# Patient Record
Sex: Female | Born: 1960 | Race: White | Hispanic: No | Marital: Married | Smoking: Former smoker
Health system: Southern US, Community
[De-identification: ages and names within clinical notes are randomized; demographics above are authoritative.]

## PROBLEM LIST (undated history)

## (undated) DIAGNOSIS — F329 Major depressive disorder, single episode, unspecified: Secondary | ICD-10-CM

## (undated) DIAGNOSIS — E039 Hypothyroidism, unspecified: Secondary | ICD-10-CM

## (undated) DIAGNOSIS — F419 Anxiety disorder, unspecified: Secondary | ICD-10-CM

## (undated) DIAGNOSIS — Z8489 Family history of other specified conditions: Secondary | ICD-10-CM

## (undated) DIAGNOSIS — Z9889 Other specified postprocedural states: Secondary | ICD-10-CM

## (undated) DIAGNOSIS — R112 Nausea with vomiting, unspecified: Secondary | ICD-10-CM

## (undated) DIAGNOSIS — Z8614 Personal history of Methicillin resistant Staphylococcus aureus infection: Secondary | ICD-10-CM

## (undated) DIAGNOSIS — R569 Unspecified convulsions: Secondary | ICD-10-CM

## (undated) DIAGNOSIS — E079 Disorder of thyroid, unspecified: Secondary | ICD-10-CM

## (undated) DIAGNOSIS — K219 Gastro-esophageal reflux disease without esophagitis: Secondary | ICD-10-CM

## (undated) DIAGNOSIS — T4145XA Adverse effect of unspecified anesthetic, initial encounter: Secondary | ICD-10-CM

## (undated) DIAGNOSIS — G5600 Carpal tunnel syndrome, unspecified upper limb: Secondary | ICD-10-CM

## (undated) DIAGNOSIS — F32A Depression, unspecified: Secondary | ICD-10-CM

## (undated) DIAGNOSIS — T8859XA Other complications of anesthesia, initial encounter: Secondary | ICD-10-CM

## (undated) HISTORY — PX: TUBAL LIGATION: SHX77

## (undated) HISTORY — PX: LASIK: SHX215

---

## 1988-07-03 HISTORY — PX: TUBAL LIGATION: SHX77

## 2006-07-03 HISTORY — PX: LASIK: SHX215

## 2014-09-18 DIAGNOSIS — E039 Hypothyroidism, unspecified: Secondary | ICD-10-CM | POA: Insufficient documentation

## 2014-09-18 DIAGNOSIS — N951 Menopausal and female climacteric states: Secondary | ICD-10-CM | POA: Insufficient documentation

## 2014-09-18 DIAGNOSIS — F419 Anxiety disorder, unspecified: Secondary | ICD-10-CM | POA: Insufficient documentation

## 2015-02-05 ENCOUNTER — Encounter: Payer: Self-pay | Admitting: Emergency Medicine

## 2015-02-05 ENCOUNTER — Ambulatory Visit: Payer: No Typology Code available for payment source

## 2015-02-05 ENCOUNTER — Ambulatory Visit
Admission: EM | Admit: 2015-02-05 | Discharge: 2015-02-05 | Disposition: A | Discharge status: Home / Self Care | Payer: No Typology Code available for payment source | Attending: Family Medicine | Admitting: Family Medicine

## 2015-02-05 DIAGNOSIS — E079 Disorder of thyroid, unspecified: Secondary | ICD-10-CM | POA: Diagnosis not present

## 2015-02-05 DIAGNOSIS — S29011A Strain of muscle and tendon of front wall of thorax, initial encounter: Secondary | ICD-10-CM

## 2015-02-05 DIAGNOSIS — F17210 Nicotine dependence, cigarettes, uncomplicated: Secondary | ICD-10-CM | POA: Insufficient documentation

## 2015-02-05 DIAGNOSIS — Z79899 Other long term (current) drug therapy: Secondary | ICD-10-CM | POA: Diagnosis not present

## 2015-02-05 DIAGNOSIS — S161XXA Strain of muscle, fascia and tendon at neck level, initial encounter: Secondary | ICD-10-CM | POA: Insufficient documentation

## 2015-02-05 DIAGNOSIS — M542 Cervicalgia: Secondary | ICD-10-CM | POA: Diagnosis present

## 2015-02-05 DIAGNOSIS — R079 Chest pain, unspecified: Secondary | ICD-10-CM | POA: Diagnosis present

## 2015-02-05 HISTORY — DX: Disorder of thyroid, unspecified: E07.9

## 2015-02-05 LAB — URINALYSIS COMPLETE WITH MICROSCOPIC (ARMC ONLY)
BACTERIA UA: NONE SEEN — AB
BILIRUBIN URINE: NEGATIVE
Glucose, UA: NEGATIVE mg/dL
Hgb urine dipstick: NEGATIVE
KETONES UR: NEGATIVE mg/dL
Leukocytes, UA: NEGATIVE
Nitrite: NEGATIVE
PROTEIN: 30 mg/dL — AB
Specific Gravity, Urine: 1.025 (ref 1.005–1.030)
pH: 7 (ref 5.0–8.0)

## 2015-02-05 MED ORDER — CYCLOBENZAPRINE HCL 10 MG PO TABS: 10.0000 mg | ORAL_TABLET | Freq: Three times a day (TID) | ORAL | Status: DC | PRN
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
## 2015-02-05 NOTE — Discharge Instructions (Signed)
Cervical Sprain A cervical sprain is when the tissues (ligaments) that hold the neck bones in place stretch or tear. HOME CARE   Put ice on the injured area.  Put ice in a plastic bag.  Place a towel between your skin and the bag.  Leave the ice on for 15-20 minutes, 3-4 times a day.  You may have been given a collar to wear. This collar keeps your neck from moving while you heal.  Do not take the collar off unless told by your doctor.  If you have long hair, keep it outside of the collar.  Ask your doctor before changing the position of your collar. You may need to change its position over time to make it more comfortable.  If you are allowed to take off the collar for cleaning or bathing, follow your doctor's instructions on how to do it safely.  Keep your collar clean by wiping it with mild soap and water. Dry it completely. If the collar has removable pads, remove them every 1-2 days to hand wash them with soap and water. Allow them to air dry. They should be dry before you wear them in the collar.  Do not drive while wearing the collar.  Only take medicine as told by your doctor.  Keep all doctor visits as told.  Keep all physical therapy visits as told.  Adjust your work station so that you have good posture while you work.  Avoid positions and activities that make your problems worse.  Warm up and stretch before being active. GET HELP IF:  Your pain is not controlled with medicine.  You cannot take less pain medicine over time as planned.  Your activity level does not improve as expected. GET HELP RIGHT AWAY IF:   You are bleeding.  Your stomach is upset.  You have an allergic reaction to your medicine.  You develop new problems that you cannot explain.  You lose feeling (become numb) or you cannot move any part of your body (paralysis).  You have tingling or weakness in any part of your body.  Your symptoms get worse. Symptoms include:  Pain,  soreness, stiffness, puffiness (swelling), or a burning feeling in your neck.  Pain when your neck is touched.  Shoulder or upper back pain.  Limited ability to move your neck.  Headache.  Dizziness.  Your hands or arms feel week, lose feeling, or tingle.  Muscle spasms.  Difficulty swallowing or chewing. MAKE SURE YOU:   Understand these instructions.  Will watch your condition.  Will get help right away if you are not doing well or get worse. Document Released: 12/06/2007 Document Revised: 02/19/2013 Document Reviewed: 12/25/2012 [facility] Patient Information 2015 [place], [place]. This information is not intended to replace advice given to you by your health care provider. Make sure you discuss any questions you have with your health care provider. Costochondritis Costochondritis, sometimes called Tietze syndrome, is a swelling and irritation (inflammation) of the tissue (cartilage) that connects your ribs with your breastbone (sternum). It causes pain in the chest and rib area. Costochondritis usually goes away on its own over time. It can take up to 6 weeks or longer to get better, especially if you are unable to limit your activities. CAUSES  Some cases of costochondritis have no known cause. Possible causes include: Injury (trauma). Exercise or activity such as lifting. Severe coughing. SIGNS AND SYMPTOMS Pain and tenderness in the chest and rib area. Pain that gets worse when coughing or  taking deep breaths. Pain that gets worse with specific movements. DIAGNOSIS  Your health care provider will do a physical exam and ask about your symptoms. Chest X-rays or other tests may be done to rule out other problems. TREATMENT  Costochondritis usually goes away on its own over time. Your health care provider may prescribe medicine to help relieve pain. HOME CARE INSTRUCTIONS  Avoid exhausting physical activity. Try not to strain your ribs during normal activity. This would  include any activities using chest, abdominal, and side muscles, especially if heavy weights are used. Apply ice to the affected area for the first 2 days after the pain begins. Put ice in a plastic bag. Place a towel between your skin and the bag. Leave the ice on for 20 minutes, 2-3 times a day. Only take over-the-counter or prescription medicines as directed by your health care provider. SEEK MEDICAL CARE IF: You have redness or swelling at the rib joints. These are signs of infection. Your pain does not go away despite rest or medicine. SEEK IMMEDIATE MEDICAL CARE IF:  Your pain increases or you are very uncomfortable. You have shortness of breath or difficulty breathing. You cough up blood. You have worse chest pains, sweating, or vomiting. You have a fever or persistent symptoms for more than 2-3 days. You have a fever and your symptoms suddenly get worse. MAKE SURE YOU:  Understand these instructions. Will watch your condition. Will get help right away if you are not doing well or get worse. Document Released: 03/29/2005 Document Revised: 04/09/2013 Document Reviewed: 01/21/2013 [facility] Patient Information 2015 [place], [place]. This information is not intended to replace advice given to you by your health care provider. Make sure you discuss any questions you have with your health care provider.

## 2015-09-20 ENCOUNTER — Other Ambulatory Visit: Payer: Self-pay | Admitting: Family Medicine

## 2015-09-20 DIAGNOSIS — Z8249 Family history of ischemic heart disease and other diseases of the circulatory system: Secondary | ICD-10-CM

## 2015-09-23 ENCOUNTER — Ambulatory Visit: Admission: RE | Admit: 2015-09-23 | Payer: BLUE CROSS/BLUE SHIELD | Source: Ambulatory Visit

## 2015-09-24 ENCOUNTER — Ambulatory Visit: Payer: No Typology Code available for payment source

## 2015-09-28 ENCOUNTER — Ambulatory Visit
Admission: RE | Admit: 2015-09-28 | Discharge: 2015-09-28 | Disposition: A | Discharge status: Home / Self Care | Payer: BLUE CROSS/BLUE SHIELD | Source: Ambulatory Visit | Attending: Family Medicine | Admitting: Family Medicine

## 2015-09-28 DIAGNOSIS — Z8249 Family history of ischemic heart disease and other diseases of the circulatory system: Secondary | ICD-10-CM

## 2015-09-28 MED ORDER — IOPAMIDOL (ISOVUE-370) INJECTION 76%
75.0000 mL | Freq: Once | INTRAVENOUS | Status: AC | PRN
  Administered 2015-09-28: 75 mL via INTRAVENOUS

## 2017-05-15 ENCOUNTER — Other Ambulatory Visit: Payer: Self-pay | Admitting: Family Medicine

## 2017-05-15 DIAGNOSIS — Z1231 Encounter for screening mammogram for malignant neoplasm of breast: Secondary | ICD-10-CM

## 2017-06-07 ENCOUNTER — Ambulatory Visit
Admission: RE | Admit: 2017-06-07 | Discharge: 2017-06-07 | Disposition: A | Discharge status: Home / Self Care | Payer: BLUE CROSS/BLUE SHIELD | Source: Ambulatory Visit | Attending: Family Medicine | Admitting: Family Medicine

## 2017-06-07 DIAGNOSIS — Z1231 Encounter for screening mammogram for malignant neoplasm of breast: Secondary | ICD-10-CM | POA: Insufficient documentation

## 2018-03-22 ENCOUNTER — Ambulatory Visit: Payer: Self-pay | Admitting: Surgery

## 2018-03-22 NOTE — H&P (Signed)
Subjective:   CC: Lump of skin of back [R22.2]  HPI:  Julie Pace is a 57 y.o. female who was referred by Heron Nay * for evaluation of above. First noted several years ago.  Symptoms include: Pain is intermittent, discomfort while carrying a purse on that shoulder.  Exacerbated by above.  Alleviated by nothing specific.  Associated with no other symptoms such as overlying skin change or discharge to to the area.     Past Medical History:  has a past medical history of Anxiety, Hypothyroidism, unspecified, and Menopause.  Past Surgical History:  has a past surgical history that includes Tubal ligation.  Family History: family history includes Alzheimer's disease in her mother; Coronary Artery Disease (Blocked arteries around heart) in her father and mother; Dementia in her father.  Social History:  reports that she has quit smoking. She has never used smokeless tobacco. She reports that she does not drink alcohol or use drugs.  Current Medications: has a current medication list which includes the following prescription(s): levothyroxine and naproxen.  Allergies:      Allergies  Allergen Reactions  . Codeine Rash  . Penicillin Dizziness  . Vicodin [Hydrocodone-Acetaminophen] Nausea    ROS:  A 15 point review of systems was performed and pertinent positives and negatives noted in HPI   Objective:   BP 126/67   Pulse 64   Temp 36.7 C (98 F) (Oral)   Ht 172.7 cm (5\' 8" )   Wt 78.9 kg (173 lb 15.1 oz)   BMI 26.45 kg/m   Constitutional :  alert, appears stated age, cooperative and no distress  Lymphatics/Throat:  no asymmetry, masses, or scars  Respiratory:  clear to auscultation bilaterally  Cardiovascular:  regular rate and rhythm, S1, S2 normal, no murmur, click, rub or gallop  Gastrointestinal: soft, non-tender; bowel sounds normal; no masses,  no organomegaly.    Musculoskeletal: Steady gait and movement  Skin: Cool and moist, right shoulder  lump at base of neck and right shoulder on trapezious, somewhat deep and immobile, but non-tender, measuring approx 5cm x 3.5cm.  No overlying skin changes  Psychiatric: Normal affect, non-agitated, not confused       LABS:  n/a   RADS: n/a  Assessment:      Lump of skin of back [R22.2]  Plan:   1. Lump of skin of back [R22.2] Discussed surgical excision.  Alternatives include continued observation.  Benefits include possible symptom relief, pathologic evaluation, improved cosmesis. Discussed the risk of surgery including recurrence, chronic pain, post-op infxn, poor cosmesis, poor/delayed wound healing, and possible re-operation to address said risks. The risks of general anesthetic, if used, includes MI, CVA, sudden death or even reaction to anesthetic medications also discussed.  Typical post-op recovery time of 3-5 days with possible activity restrictions were also discussed.  The patient verbalized understanding and all questions were answered to the patient's satisfaction.  2. Patient has elected to proceed with surgical treatment in OR due to possible involvement of muscle deep to it as well as its location near the neck. Procedure will be scheduled.  Written consent was obtained.     Electronically signed by Benjamine Sprague, DO on 03/20/2018 11:32 AM

## 2018-03-22 NOTE — H&P (View-Only) (Signed)
Subjective:   CC: Lump of skin of back [R22.2]  HPI:  Julie Pace is a 57 y.o. female who was referred by Heron Nay * for evaluation of above. First noted several years ago.  Symptoms include: Pain is intermittent, discomfort while carrying a purse on that shoulder.  Exacerbated by above.  Alleviated by nothing specific.  Associated with no other symptoms such as overlying skin change or discharge to to the area.     Past Medical History:  has a past medical history of Anxiety, Hypothyroidism, unspecified, and Menopause.  Past Surgical History:  has a past surgical history that includes Tubal ligation.  Family History: family history includes Alzheimer's disease in her mother; Coronary Artery Disease (Blocked arteries around heart) in her father and mother; Dementia in her father.  Social History:  reports that she has quit smoking. She has never used smokeless tobacco. She reports that she does not drink alcohol or use drugs.  Current Medications: has a current medication list which includes the following prescription(s): levothyroxine and naproxen.  Allergies:      Allergies  Allergen Reactions  . Codeine Rash  . Penicillin Dizziness  . Vicodin [Hydrocodone-Acetaminophen] Nausea    ROS:  A 15 point review of systems was performed and pertinent positives and negatives noted in HPI   Objective:   BP 126/67   Pulse 64   Temp 36.7 C (98 F) (Oral)   Ht 172.7 cm (5\' 8" )   Wt 78.9 kg (173 lb 15.1 oz)   BMI 26.45 kg/m   Constitutional :  alert, appears stated age, cooperative and no distress  Lymphatics/Throat:  no asymmetry, masses, or scars  Respiratory:  clear to auscultation bilaterally  Cardiovascular:  regular rate and rhythm, S1, S2 normal, no murmur, click, rub or gallop  Gastrointestinal: soft, non-tender; bowel sounds normal; no masses,  no organomegaly.    Musculoskeletal: Steady gait and movement  Skin: Cool and moist, right shoulder  lump at base of neck and right shoulder on trapezious, somewhat deep and immobile, but non-tender, measuring approx 5cm x 3.5cm.  No overlying skin changes  Psychiatric: Normal affect, non-agitated, not confused       LABS:  n/a   RADS: n/a  Assessment:      Lump of skin of back [R22.2]  Plan:   1. Lump of skin of back [R22.2] Discussed surgical excision.  Alternatives include continued observation.  Benefits include possible symptom relief, pathologic evaluation, improved cosmesis. Discussed the risk of surgery including recurrence, chronic pain, post-op infxn, poor cosmesis, poor/delayed wound healing, and possible re-operation to address said risks. The risks of general anesthetic, if used, includes MI, CVA, sudden death or even reaction to anesthetic medications also discussed.  Typical post-op recovery time of 3-5 days with possible activity restrictions were also discussed.  The patient verbalized understanding and all questions were answered to the patient's satisfaction.  2. Patient has elected to proceed with surgical treatment in OR due to possible involvement of muscle deep to it as well as its location near the neck. Procedure will be scheduled.  Written consent was obtained.     Electronically signed by Benjamine Sprague, DO on 03/20/2018 11:32 AM

## 2018-03-26 ENCOUNTER — Other Ambulatory Visit: Payer: Self-pay

## 2018-03-26 ENCOUNTER — Encounter
Admission: RE | Admit: 2018-03-26 | Discharge: 2018-03-26 | Disposition: A | Discharge status: Home / Self Care | Payer: BLUE CROSS/BLUE SHIELD | Source: Ambulatory Visit | Attending: Surgery | Admitting: Surgery

## 2018-03-26 HISTORY — DX: Other complications of anesthesia, initial encounter: T88.59XA

## 2018-03-26 HISTORY — DX: Family history of other specified conditions: Z84.89

## 2018-03-26 HISTORY — DX: Depression, unspecified: F32.A

## 2018-03-26 HISTORY — DX: Carpal tunnel syndrome, unspecified upper limb: G56.00

## 2018-03-26 HISTORY — DX: Nausea with vomiting, unspecified: R11.2

## 2018-03-26 HISTORY — DX: Hypothyroidism, unspecified: E03.9

## 2018-03-26 HISTORY — DX: Major depressive disorder, single episode, unspecified: F32.9

## 2018-03-26 HISTORY — DX: Personal history of Methicillin resistant Staphylococcus aureus infection: Z86.14

## 2018-03-26 HISTORY — DX: Adverse effect of unspecified anesthetic, initial encounter: T41.45XA

## 2018-03-26 HISTORY — DX: Anxiety disorder, unspecified: F41.9

## 2018-03-26 HISTORY — DX: Other specified postprocedural states: Z98.890

## 2018-03-26 HISTORY — DX: Unspecified convulsions: R56.9

## 2018-03-26 HISTORY — DX: Gastro-esophageal reflux disease without esophagitis: K21.9

## 2018-03-26 NOTE — Patient Instructions (Signed)
Your procedure is scheduled on: 04-02-18  Report to Same Day Surgery 2nd floor medical mall [facility] Entrance-take elevator on left to 2nd floor.  Check in with surgery information desk.) To find out your arrival time please call [phone] between 1PM - 3PM on 04-01-18  Remember: Instructions that are not followed completely may result in serious medical risk, up to and including death, or upon the discretion of your surgeon and anesthesiologist your surgery may need to be rescheduled.    _x___ 1. Do not eat food after midnight the night before your procedure. You may drink clear liquids up to 2 hours before you are scheduled to arrive at the hospital for your procedure.  Do not drink clear liquids within 2 hours of your scheduled arrival to the hospital.  Clear liquids include  --Water or Apple juice without pulp  --Clear carbohydrate beverage such as ClearFast or Gatorade  --Black Coffee or Clear Tea (No milk, no creamers, do not add anything to the coffee or Tea   ____Ensure clear carbohydrate drink on the way to the hospital for bariatric patients  ____Ensure clear carbohydrate drink 3 hours before surgery for Dr Dwyane Luo patients if physician instructed.   No gum chewing or hard candies.     __x__ 2. No Alcohol for 24 hours before or after surgery.   __x__3. No Smoking or e-cigarettes for 24 prior to surgery.  Do not use any chewable tobacco products for at least 6 hour prior to surgery   ____  4. Bring all medications with you on the day of surgery if instructed.    __x__ 5. Notify your doctor if there is any change in your medical condition     (cold, fever, infections).    x___6. On the morning of surgery brush your teeth with toothpaste and water.  You may rinse your mouth with mouth wash if you wish.  Do not swallow any toothpaste or mouthwash.   Do not wear jewelry, make-up, hairpins, clips or nail polish.  Do not wear lotions, powders, or perfumes. You may wear  deodorant.  Do not shave 48 hours prior to surgery. Men may shave face and neck.  Do not bring valuables to the hospital.    [facility] is not responsible for any belongings or valuables.               Contacts, dentures or bridgework may not be worn into surgery.  Leave your suitcase in the car. After surgery it may be brought to your room.  For patients admitted to the hospital, discharge time is determined by your treatment team.  _  Patients discharged the day of surgery will not be allowed to drive home.  You will need someone to drive you home and stay with you the night of your procedure.    Please read over the following fact sheets that you were given:   [facility] Preparing for Surgery   _x___ TAKE THE FOLLOWING MEDICATION THE MORNING OF SURGERY WITH A SMALL SIP OF WATER. These include:  1. LEVOTHYROXINE  2. ZANTAC  3. TAKE A ZANTAC THE NIGHT BEFORE YOUR SURGERY  4.  5.  6.  ____Fleets enema or Magnesium Citrate as directed.   _x___ Use CHG Soap or sage wipes as directed on instruction sheet   ____ Use inhalers on the day of surgery and bring to hospital day of surgery  ____ Stop Metformin and Janumet 2 days prior to surgery.    ____  Take 1/2 of usual insulin dose the night before surgery and none on the morning surgery.   ____ Follow recommendations from Cardiologist, Pulmonologist or PCP regarding stopping Aspirin, Coumadin, Plavix ,Eliquis, Effient, or Pradaxa, and Pletal.  X____Stop Anti-inflammatories such as Advil, Aleve, Ibuprofen, Motrin, Naproxen, Naprosyn, Goodies powders or aspirin products NOW-OK to take Tylenol    _X___ Stop supplements until after surgery-STOP BLACK CURRANT SEED OIL NOW-MAY RESUME AFTER SURGERY   ____ Bring C-Pap to the hospital.

## 2018-04-01 ENCOUNTER — Encounter: Payer: Self-pay | Admitting: *Deleted

## 2018-04-02 ENCOUNTER — Ambulatory Visit
Admission: RE | Admit: 2018-04-02 | Discharge: 2018-04-02 | Disposition: A | Discharge status: Home / Self Care | Payer: BLUE CROSS/BLUE SHIELD | Source: Ambulatory Visit | Attending: Surgery | Admitting: Surgery

## 2018-04-02 ENCOUNTER — Encounter: Payer: Self-pay | Admitting: Emergency Medicine

## 2018-04-02 ENCOUNTER — Encounter
Admission: RE | Disposition: A | Discharge status: Home / Self Care | Payer: Self-pay | Source: Ambulatory Visit | Attending: Surgery

## 2018-04-02 ENCOUNTER — Ambulatory Visit: Payer: BLUE CROSS/BLUE SHIELD | Admitting: Certified Registered Nurse Anesthetist

## 2018-04-02 ENCOUNTER — Other Ambulatory Visit: Payer: Self-pay

## 2018-04-02 DIAGNOSIS — Z880 Allergy status to penicillin: Secondary | ICD-10-CM | POA: Diagnosis not present

## 2018-04-02 DIAGNOSIS — Z820 Family history of epilepsy and other diseases of the nervous system: Secondary | ICD-10-CM | POA: Diagnosis not present

## 2018-04-02 DIAGNOSIS — F419 Anxiety disorder, unspecified: Secondary | ICD-10-CM | POA: Diagnosis not present

## 2018-04-02 DIAGNOSIS — K219 Gastro-esophageal reflux disease without esophagitis: Secondary | ICD-10-CM | POA: Insufficient documentation

## 2018-04-02 DIAGNOSIS — E039 Hypothyroidism, unspecified: Secondary | ICD-10-CM | POA: Diagnosis not present

## 2018-04-02 DIAGNOSIS — Z79899 Other long term (current) drug therapy: Secondary | ICD-10-CM | POA: Insufficient documentation

## 2018-04-02 DIAGNOSIS — Z8249 Family history of ischemic heart disease and other diseases of the circulatory system: Secondary | ICD-10-CM | POA: Diagnosis not present

## 2018-04-02 DIAGNOSIS — Z885 Allergy status to narcotic agent status: Secondary | ICD-10-CM | POA: Diagnosis not present

## 2018-04-02 DIAGNOSIS — R222 Localized swelling, mass and lump, trunk: Secondary | ICD-10-CM

## 2018-04-02 DIAGNOSIS — Z888 Allergy status to other drugs, medicaments and biological substances status: Secondary | ICD-10-CM | POA: Insufficient documentation

## 2018-04-02 DIAGNOSIS — R569 Unspecified convulsions: Secondary | ICD-10-CM | POA: Diagnosis not present

## 2018-04-02 DIAGNOSIS — Z886 Allergy status to analgesic agent status: Secondary | ICD-10-CM | POA: Insufficient documentation

## 2018-04-02 DIAGNOSIS — Z8614 Personal history of Methicillin resistant Staphylococcus aureus infection: Secondary | ICD-10-CM | POA: Diagnosis not present

## 2018-04-02 DIAGNOSIS — Z87891 Personal history of nicotine dependence: Secondary | ICD-10-CM | POA: Diagnosis not present

## 2018-04-02 DIAGNOSIS — D171 Benign lipomatous neoplasm of skin and subcutaneous tissue of trunk: Secondary | ICD-10-CM | POA: Diagnosis not present

## 2018-04-02 HISTORY — PX: EXCISION MASS UPPER EXTREMETIES: SHX6704

## 2018-04-02 SURGERY — EXCISION MASS UPPER EXTREMITIES
Anesthesia: General | Site: Shoulder | Laterality: Right

## 2018-04-02 MED ORDER — FENTANYL CITRATE (PF) 100 MCG/2ML IJ SOLN
INTRAMUSCULAR | Status: DC | PRN
  Administered 2018-04-02 (×2): 50 ug via INTRAVENOUS

## 2018-04-02 MED ORDER — SCOPOLAMINE 1 MG/3DAYS TD PT72
1.0000 | MEDICATED_PATCH | Freq: Once | TRANSDERMAL | Status: DC
  Administered 2018-04-02: 1.5 mg via TRANSDERMAL

## 2018-04-02 MED ORDER — SUCCINYLCHOLINE CHLORIDE 20 MG/ML IJ SOLN
INTRAMUSCULAR | Status: AC
  Filled 2018-04-02: qty 1

## 2018-04-02 MED ORDER — ONDANSETRON HCL 4 MG/2ML IJ SOLN
INTRAMUSCULAR | Status: DC | PRN
  Administered 2018-04-02: 4 mg via INTRAVENOUS

## 2018-04-02 MED ORDER — PROPOFOL 10 MG/ML IV BOLUS
INTRAVENOUS | Status: DC | PRN
  Administered 2018-04-02: 200 mg via INTRAVENOUS

## 2018-04-02 MED ORDER — DEXAMETHASONE SODIUM PHOSPHATE 10 MG/ML IJ SOLN
INTRAMUSCULAR | Status: DC | PRN
  Administered 2018-04-02: 10 mg via INTRAVENOUS

## 2018-04-02 MED ORDER — LIDOCAINE-EPINEPHRINE (PF) 1 %-1:200000 IJ SOLN
INTRAMUSCULAR | Status: AC
  Filled 2018-04-02: qty 30

## 2018-04-02 MED ORDER — LIDOCAINE HCL (PF) 2 % IJ SOLN
INTRAMUSCULAR | Status: AC
  Filled 2018-04-02: qty 10

## 2018-04-02 MED ORDER — FENTANYL CITRATE (PF) 100 MCG/2ML IJ SOLN
INTRAMUSCULAR | Status: AC
  Filled 2018-04-02: qty 2

## 2018-04-02 MED ORDER — TRAMADOL HCL 50 MG PO TABS: 50.0000 mg | ORAL_TABLET | Freq: Four times a day (QID) | ORAL | 0 refills | Status: AC | PRN

## 2018-04-02 MED ORDER — ROCURONIUM BROMIDE 100 MG/10ML IV SOLN
INTRAVENOUS | Status: DC | PRN
  Administered 2018-04-02: 5 mg via INTRAVENOUS

## 2018-04-02 MED ORDER — SUCCINYLCHOLINE CHLORIDE 20 MG/ML IJ SOLN
INTRAMUSCULAR | Status: DC | PRN
  Administered 2018-04-02: 100 mg via INTRAVENOUS

## 2018-04-02 MED ORDER — PHENYLEPHRINE HCL 10 MG/ML IJ SOLN
INTRAMUSCULAR | Status: DC | PRN
  Administered 2018-04-02 (×2): 200 ug via INTRAVENOUS

## 2018-04-02 MED ORDER — BUPIVACAINE HCL (PF) 0.5 % IJ SOLN
INTRAMUSCULAR | Status: AC
  Filled 2018-04-02: qty 30

## 2018-04-02 MED ORDER — LACTATED RINGERS IV SOLN
INTRAVENOUS | Status: DC
  Administered 2018-04-02: 06:00:00 via INTRAVENOUS

## 2018-04-02 MED ORDER — CHLORHEXIDINE GLUCONATE CLOTH 2 % EX PADS: 6.0000 | MEDICATED_PAD | Freq: Once | CUTANEOUS | Status: DC

## 2018-04-02 MED ORDER — MIDAZOLAM HCL 2 MG/2ML IJ SOLN
INTRAMUSCULAR | Status: AC
  Filled 2018-04-02: qty 2

## 2018-04-02 MED ORDER — ROCURONIUM BROMIDE 50 MG/5ML IV SOLN
INTRAVENOUS | Status: AC
  Filled 2018-04-02: qty 1

## 2018-04-02 MED ORDER — LIDOCAINE-EPINEPHRINE (PF) 1 %-1:200000 IJ SOLN
INTRAMUSCULAR | Status: DC | PRN
  Administered 2018-04-02: 1.5 mL

## 2018-04-02 MED ORDER — IBUPROFEN 800 MG PO TABS: 800.0000 mg | ORAL_TABLET | Freq: Three times a day (TID) | ORAL | 0 refills | Status: DC | PRN

## 2018-04-02 MED ORDER — ACETAMINOPHEN 500 MG PO TABS
1000.0000 mg | ORAL_TABLET | ORAL | Status: AC
  Administered 2018-04-02: 1000 mg via ORAL

## 2018-04-02 MED ORDER — SCOPOLAMINE 1 MG/3DAYS TD PT72
MEDICATED_PATCH | TRANSDERMAL | Status: AC
  Filled 2018-04-02: qty 1

## 2018-04-02 MED ORDER — ONDANSETRON HCL 4 MG/2ML IJ SOLN
INTRAMUSCULAR | Status: AC
  Filled 2018-04-02: qty 2

## 2018-04-02 MED ORDER — ACETAMINOPHEN 325 MG PO TABS: 650.0000 mg | ORAL_TABLET | Freq: Three times a day (TID) | ORAL | 0 refills | Status: AC | PRN

## 2018-04-02 MED ORDER — SEVOFLURANE IN SOLN
RESPIRATORY_TRACT | Status: AC
  Filled 2018-04-02: qty 250

## 2018-04-02 MED ORDER — FENTANYL CITRATE (PF) 100 MCG/2ML IJ SOLN: 25.0000 ug | INTRAMUSCULAR | Status: DC | PRN

## 2018-04-02 MED ORDER — MIDAZOLAM HCL 2 MG/2ML IJ SOLN
INTRAMUSCULAR | Status: DC | PRN
  Administered 2018-04-02: 2 mg via INTRAVENOUS

## 2018-04-02 MED ORDER — ACETAMINOPHEN 500 MG PO TABS
ORAL_TABLET | ORAL | Status: AC
  Administered 2018-04-02: 1000 mg via ORAL
  Filled 2018-04-02: qty 2

## 2018-04-02 MED ORDER — CLINDAMYCIN PHOSPHATE 900 MG/50ML IV SOLN
900.0000 mg | INTRAVENOUS | Status: AC
  Administered 2018-04-02: 900 mg via INTRAVENOUS

## 2018-04-02 MED ORDER — PROMETHAZINE HCL 25 MG/ML IJ SOLN: 6.2500 mg | INTRAMUSCULAR | Status: DC | PRN

## 2018-04-02 MED ORDER — PROPOFOL 500 MG/50ML IV EMUL
INTRAVENOUS | Status: AC
  Filled 2018-04-02: qty 50

## 2018-04-02 MED ORDER — DEXAMETHASONE SODIUM PHOSPHATE 10 MG/ML IJ SOLN
INTRAMUSCULAR | Status: AC
  Filled 2018-04-02: qty 1

## 2018-04-02 MED ORDER — CLINDAMYCIN PHOSPHATE 900 MG/50ML IV SOLN
INTRAVENOUS | Status: AC
  Filled 2018-04-02: qty 50

## 2018-04-02 MED ORDER — BUPIVACAINE HCL 0.5 % IJ SOLN
INTRAMUSCULAR | Status: DC | PRN
  Administered 2018-04-02: 1.5 mL

## 2018-04-02 MED ORDER — DOCUSATE SODIUM 100 MG PO CAPS: 100.0000 mg | ORAL_CAPSULE | Freq: Two times a day (BID) | ORAL | 0 refills | Status: AC | PRN

## 2018-04-02 MED ORDER — LIDOCAINE HCL (CARDIAC) PF 100 MG/5ML IV SOSY
PREFILLED_SYRINGE | INTRAVENOUS | Status: DC | PRN
  Administered 2018-04-02: 100 mg via INTRAVENOUS

## 2018-04-02 SURGICAL SUPPLY — 27 items
CHLORAPREP W/TINT 26ML (MISCELLANEOUS) ×3 IMPLANT
DERMABOND ADVANCED (GAUZE/BANDAGES/DRESSINGS) ×2
DERMABOND ADVANCED .7 DNX12 (GAUZE/BANDAGES/DRESSINGS) ×1 IMPLANT
DRAPE LAP WINGED 104X35X124 (DRAPES) IMPLANT
DRAPE LAPAROTOMY 100X77 ABD (DRAPES) ×3 IMPLANT
DRAPE SHEET LG 3/4 BI-LAMINATE (DRAPES) IMPLANT
ELECT CAUTERY BLADE 6.4 (BLADE) ×3 IMPLANT
ELECT REM PT RETURN 9FT ADLT (ELECTROSURGICAL) ×3
ELECTRODE REM PT RTRN 9FT ADLT (ELECTROSURGICAL) ×1 IMPLANT
GLOVE BIO SURGEON STRL SZ 6.5 (GLOVE) ×8 IMPLANT
GLOVE BIO SURGEONS STRL SZ 6.5 (GLOVE) ×4
GLOVE BIOGEL PI IND STRL 7.0 (GLOVE) ×1 IMPLANT
GLOVE BIOGEL PI INDICATOR 7.0 (GLOVE) ×2
GOWN STRL REUS W/ TWL LRG LVL3 (GOWN DISPOSABLE) ×4 IMPLANT
GOWN STRL REUS W/TWL LRG LVL3 (GOWN DISPOSABLE) ×8
KIT TURNOVER KIT A (KITS) ×3 IMPLANT
LABEL OR SOLS (LABEL) ×3 IMPLANT
NEEDLE HYPO 22GX1.5 SAFETY (NEEDLE) ×3 IMPLANT
NS IRRIG 1000ML POUR BTL (IV SOLUTION) ×3 IMPLANT
PACK BASIN MINOR ARMC (MISCELLANEOUS) ×3 IMPLANT
SUT MNCRL 4-0 (SUTURE) ×2
SUT MNCRL 4-0 27XMFL (SUTURE) ×1
SUT VIC AB 3-0 SH 27 (SUTURE) ×2
SUT VIC AB 3-0 SH 27X BRD (SUTURE) ×1 IMPLANT
SUTURE MNCRL 4-0 27XMF (SUTURE) ×1 IMPLANT
SYR 30ML LL (SYRINGE) ×3 IMPLANT
TOWEL OR 17X26 4PK STRL BLUE (TOWEL DISPOSABLE) ×3 IMPLANT

## 2018-04-02 NOTE — Op Note (Signed)
Pre-Op Dx: lump on back Post-Op Dx: same Surgeon: Lysle Pearl Anesthesia: local EBL: minimal Complications:  none apparent Specimen: back mass Procedure: excisional biopsy of back mass   Description of Procedure:  Consent obtained, time out performed.  Patient placed in prone position.  Area sterilized and draped in usual position.  Local infused to area previously marked.  3.5cm incision made through dermis with 15blade and mass noted in subcutaneous layer.  The  2cm x 2cm x 1cm mass then removed from surrounding tissue completely using electrocautery down to fascial layer, passed off field pending pathology.  Wound hemostasis noted, then closed in two layer fashion with 3-0 vicryl in interrupted fashion for deep dermal layer, then running 4-0 monocryl in subcuticular fashion for epidermal layer.  Wound then dressed with dermabond.  Pt tolerated procedure well, and transferred to PACU in stable condition. Sponge and instrument count correct at end of procedure.

## 2018-04-02 NOTE — Discharge Instructions (Signed)
-   tylenol and advil as needed for discomfort.  Please alternate between the two every four hours as needed for pain.   - Use narcotics, if prescribed, only when tylenol and motrin is not enough to control pain. - 325-650mg  every 8hrs to max of 4000mg /24hrs for the tylenol.   - Advil up to 800mg  per dose every 8hrs as needed for pain.    AMBULATORY SURGERY  DISCHARGE INSTRUCTIONS   1) The drugs that you were given will stay in your system until tomorrow so for the next 24 hours you should not:  A) Drive an automobile B) Make any legal decisions C) Drink any alcoholic beverage   2) You may resume regular meals tomorrow.  Today it is better to start with liquids and gradually work up to solid foods.  You may eat anything you prefer, but it is better to start with liquids, then soup and crackers, and gradually work up to solid foods.   3) Please notify your doctor immediately if you have any unusual bleeding, trouble breathing, redness and pain at the surgery site, drainage, fever, or pain not relieved by medication.    4) Additional Instructions:        Please contact your physician with any problems or Same Day Surgery at [phone], Monday through Friday 6 am to 4 pm, or [place] at [facility] number at [phone].

## 2018-04-02 NOTE — Transfer of Care (Signed)
Immediate Anesthesia Transfer of Care Note  Patient: Taiya Nutting  Procedure(s) Performed: EXCISION MASS RIGHT SHOULDER (Right Shoulder)  Patient Location: PACU  Anesthesia Type:General  Level of Consciousness: drowsy  Airway & Oxygen Therapy: Patient Spontanous Breathing and Patient connected to face mask oxygen  Post-op Assessment: Report given to RN and Post -op Vital signs reviewed and stable  Post vital signs: Reviewed and stable  Last Vitals:  Vitals Value Taken Time  BP 127/69 04/02/2018  8:38 AM  Temp 36.3 C 04/02/2018  8:38 AM  Pulse 74 04/02/2018  8:40 AM  Resp 13 04/02/2018  8:40 AM  SpO2 97 % 04/02/2018  8:40 AM  Vitals shown include unvalidated device data.  Last Pain:  Vitals:   04/02/18 0838  TempSrc:   PainSc: Asleep         Complications: No apparent anesthesia complications

## 2018-04-02 NOTE — Anesthesia Preprocedure Evaluation (Signed)
Anesthesia Evaluation  Patient identified by MRN, date of birth, ID band Patient awake    Reviewed: Allergy & Precautions, H&P , NPO status , Patient's Chart, lab work & pertinent test results, reviewed documented beta blocker date and time   History of Anesthesia Complications (+) PONV, Family history of anesthesia reaction and history of anesthetic complications  Airway Mallampati: III  TM Distance: >3 FB Neck ROM: full    Dental  (+) Dental Advidsory Given, Caps, Teeth Intact   Pulmonary neg pulmonary ROS, former smoker,           Cardiovascular Exercise Tolerance: Good negative cardio ROS       Neuro/Psych Seizures -, Well Controlled,  PSYCHIATRIC DISORDERS Anxiety Depression    GI/Hepatic negative GI ROS, Neg liver ROS, GERD  ,  Endo/Other  neg diabetesHypothyroidism   Renal/GU negative Renal ROS  negative genitourinary   Musculoskeletal   Abdominal   Peds  Hematology negative hematology ROS (+)   Anesthesia Other Findings Past Medical History: No date: Anxiety No date: Carpal tunnel syndrome No date: Complication of anesthesia No date: Depression No date: Family history of adverse reaction to anesthesia     Comment:  PT STATES SHE THINKS HER MOM HAD REACTION TO ANESTHESIA               BUT IS UNSURE WHAT REACTION WAS No date: GERD (gastroesophageal reflux disease) No date: History of methicillin resistant staphylococcus aureus (MRSA)     Comment:  YEARS AGO No date: Hypothyroidism No date: PONV (postoperative nausea and vomiting) No date: Seizures (HCC)     Comment:  LAST SEIZURE AGE 47 No date: Thyroid disease   Reproductive/Obstetrics negative OB ROS                             Anesthesia Physical Anesthesia Plan  ASA: II  Anesthesia Plan: General   Post-op Pain Management:    Induction: Intravenous  PONV Risk Score and Plan: 4 or greater and Ondansetron,  Dexamethasone, Midazolam, Promethazine, Treatment may vary due to age or medical condition and Scopolamine patch - Pre-op  Airway Management Planned: Oral ETT  Additional Equipment:   Intra-op Plan:   Post-operative Plan: Extubation in OR  Informed Consent: I have reviewed the patients History and Physical, chart, labs and discussed the procedure including the risks, benefits and alternatives for the proposed anesthesia with the patient or authorized representative who has indicated his/her understanding and acceptance.   Dental Advisory Given  Plan Discussed with: Anesthesiologist, CRNA and Surgeon  Anesthesia Plan Comments:         Anesthesia Quick Evaluation

## 2018-04-02 NOTE — Anesthesia Procedure Notes (Signed)
Procedure Name: Intubation Date/Time: 04/02/2018 7:41 AM Performed by: Johnna Acosta, CRNA Pre-anesthesia Checklist: Patient identified, Emergency Drugs available, Suction available, Patient being monitored and Timeout performed Patient Re-evaluated:Patient Re-evaluated prior to induction Oxygen Delivery Method: Circle system utilized Preoxygenation: Pre-oxygenation with 100% oxygen Induction Type: IV induction Ventilation: Mask ventilation without difficulty Laryngoscope Size: Miller and 2 Grade View: Grade I Tube type: Oral Tube size: 7.0 mm Number of attempts: 1 Airway Equipment and Method: Stylet Placement Confirmation: ETT inserted through vocal cords under direct vision,  positive ETCO2 and breath sounds checked- equal and bilateral Secured at: 21 cm Tube secured with: Tape Dental Injury: Teeth and Oropharynx as per pre-operative assessment

## 2018-04-02 NOTE — Anesthesia Post-op Follow-up Note (Signed)
Anesthesia QCDR form completed.        

## 2018-04-02 NOTE — Interval H&P Note (Signed)
History and Physical Interval Note:  04/02/2018 7:15 AM  Julie Pace  has presented today for surgery, with the diagnosis of LUMP OF SKIN OF BACK  The various methods of treatment have been discussed with the patient and family. After consideration of risks, benefits and other options for treatment, the patient has consented to  Procedure(s): EXCISION MASS RIGHT SHOULDER (Right) as a surgical intervention .  The patient's history has been reviewed, patient examined, no change in status, stable for surgery.  I have reviewed the patient's chart and labs.  Questions were answered to the patient's satisfaction.     [name] Lysle Pearl

## 2018-04-03 LAB — SURGICAL PATHOLOGY

## 2018-04-03 NOTE — Anesthesia Postprocedure Evaluation (Signed)
Anesthesia Post Note  Patient: Mekaela Azizi  Procedure(s) Performed: EXCISION MASS RIGHT SHOULDER (Right Shoulder)  Patient location during evaluation: PACU Anesthesia Type: General Level of consciousness: awake and alert Pain management: pain level controlled Vital Signs Assessment: post-procedure vital signs reviewed and stable Respiratory status: spontaneous breathing, nonlabored ventilation, respiratory function stable and patient connected to nasal cannula oxygen Cardiovascular status: blood pressure returned to baseline and stable Postop Assessment: no apparent nausea or vomiting Anesthetic complications: no     Last Vitals:  Vitals:   04/02/18 0911 04/02/18 1001  BP: (!) 130/55 114/61  Pulse: (!) 56 (!) 56  Resp: 14 16  Temp: 36.5 C   SpO2: 100% 100%    Last Pain:  Vitals:   04/03/18 0840  TempSrc:   PainSc: 3                  Martha Clan

## 2019-06-06 DIAGNOSIS — R42 Dizziness and giddiness: Secondary | ICD-10-CM | POA: Insufficient documentation

## 2019-06-17 DIAGNOSIS — M5136 Other intervertebral disc degeneration, lumbar region: Secondary | ICD-10-CM | POA: Insufficient documentation

## 2019-06-24 ENCOUNTER — Ambulatory Visit: Payer: BC Managed Care – PPO | Attending: Family Medicine

## 2019-06-24 ENCOUNTER — Other Ambulatory Visit: Payer: Self-pay

## 2019-06-24 DIAGNOSIS — Z20822 Contact with and (suspected) exposure to covid-19: Secondary | ICD-10-CM

## 2019-06-25 LAB — NOVEL CORONAVIRUS, NAA: SARS-CoV-2, NAA: NOT DETECTED

## 2019-06-25 LAB — SPECIMEN STATUS REPORT

## 2019-09-11 ENCOUNTER — Other Ambulatory Visit: Payer: Self-pay | Admitting: Physical Medicine and Rehabilitation

## 2019-09-11 DIAGNOSIS — M5416 Radiculopathy, lumbar region: Secondary | ICD-10-CM

## 2019-09-22 ENCOUNTER — Other Ambulatory Visit: Payer: Self-pay

## 2019-09-22 ENCOUNTER — Ambulatory Visit
Admission: RE | Admit: 2019-09-22 | Discharge: 2019-09-22 | Disposition: A | Discharge status: Home / Self Care | Payer: BC Managed Care – PPO | Source: Ambulatory Visit | Attending: Physical Medicine and Rehabilitation | Admitting: Physical Medicine and Rehabilitation

## 2019-09-22 DIAGNOSIS — M5416 Radiculopathy, lumbar region: Secondary | ICD-10-CM

## 2019-10-03 ENCOUNTER — Ambulatory Visit: Payer: BC Managed Care – PPO | Attending: Internal Medicine

## 2019-10-03 ENCOUNTER — Other Ambulatory Visit: Payer: Self-pay

## 2019-10-03 DIAGNOSIS — Z23 Encounter for immunization: Secondary | ICD-10-CM

## 2019-10-03 NOTE — Progress Notes (Signed)
   Covid-19 Vaccination Clinic  Name:  Julie Pace    MRN: [id number] DOB: 1961/03/11  10/03/2019  Julie Pace was observed post Covid-19 immunization for 15 minutes without incident. She was provided with Vaccine Information Sheet and instruction to access the V-Safe system.   Julie Pace was instructed to call 911 with any severe reactions post vaccine: Marland Kitchen Difficulty breathing  . Swelling of face and throat  . A fast heartbeat  . A bad rash all over body  . Dizziness and weakness   Immunizations Administered    Name Date Dose VIS Date Route   Pfizer COVID-19 Vaccine 10/03/2019 10:47 AM 0.3 mL 06/13/2019 Intramuscular   Manufacturer: [place]   Lot: [id number]   [place]: [id number]

## 2019-10-29 ENCOUNTER — Ambulatory Visit: Payer: BC Managed Care – PPO

## 2019-10-30 ENCOUNTER — Ambulatory Visit: Payer: BC Managed Care – PPO | Attending: Internal Medicine

## 2019-10-30 DIAGNOSIS — Z23 Encounter for immunization: Secondary | ICD-10-CM

## 2019-10-30 NOTE — Progress Notes (Signed)
   Covid-19 Vaccination Clinic  Name:  Delia Lea    MRN: [id number] DOB: 02-19-1961  10/30/2019  Ms. Long-Ramos was observed post Covid-19 immunization for 15 minutes without incident. She was provided with Vaccine Information Sheet and instruction to access the V-Safe system.   Ms. Turczyn was instructed to call 911 with any severe reactions post vaccine: Marland Kitchen Difficulty breathing  . Swelling of face and throat  . A fast heartbeat  . A bad rash all over body  . Dizziness and weakness   Immunizations Administered    Name Date Dose VIS Date Route   Pfizer COVID-19 Vaccine 10/30/2019 10:39 AM 0.3 mL 08/27/2018 Intramuscular   Manufacturer: [place]   Lot: [id number]   [place]: [id number]

## 2019-11-11 ENCOUNTER — Ambulatory Visit: Payer: BC Managed Care – PPO

## 2021-04-05 ENCOUNTER — Other Ambulatory Visit: Payer: Self-pay

## 2021-04-06 ENCOUNTER — Ambulatory Visit: Payer: BC Managed Care – PPO | Admitting: Gastroenterology

## 2021-04-06 ENCOUNTER — Other Ambulatory Visit: Payer: Self-pay

## 2021-04-06 ENCOUNTER — Encounter: Payer: Self-pay | Admitting: Gastroenterology

## 2021-04-06 VITALS — BP 146/81 | HR 87 | Temp 98.1°F | Ht 66.0 in | Wt 186.2 lb

## 2021-04-06 DIAGNOSIS — Z1211 Encounter for screening for malignant neoplasm of colon: Secondary | ICD-10-CM

## 2021-04-06 DIAGNOSIS — R194 Change in bowel habit: Secondary | ICD-10-CM

## 2021-04-06 MED ORDER — PEG 3350-KCL-NA BICARB-NACL 420 G PO SOLR: ORAL | 0 refills | Status: DC

## 2021-04-06 NOTE — Progress Notes (Signed)
Jonathon Bellows MD, MRCP(U.K) [address]  [place]  [place], [place] [address]  Main: [phone]  Fax: [phone]   Gastroenterology Consultation  Referring Provider:     Sofie Hartigan, MD Primary Care Physician:  Sofie Hartigan, MD Primary Gastroenterologist:  Dr. Jonathon Bellows  Reason for Consultation:     Change in bowel habits and colon cancer screening        HPI:   Julie Pace is a 60 y.o. y/o female referred for consultation & management  by Dr. Ellison Hughs, Chrissie Noa, MD.    She is here to see me to discuss for a screening colonoscopy average risk last 1 close to 10 years back.  She has also noticed some change in her bowel habits with an increased frequency usually in the mornings associated with flatulence.  She clearly denies any diarrhea.  This been going on for a few months.  She does consume artificial sweeteners daily and on and off the occasional diet soda.  No rectal bleeding.   Past Medical History:  Diagnosis Date   Anxiety    Carpal tunnel syndrome    Complication of anesthesia    Depression    Family history of adverse reaction to anesthesia    PT STATES SHE THINKS HER MOM HAD REACTION TO ANESTHESIA BUT IS UNSURE WHAT REACTION WAS   GERD (gastroesophageal reflux disease)    History of methicillin resistant staphylococcus aureus (MRSA)    YEARS AGO   Hypothyroidism    PONV (postoperative nausea and vomiting)    Seizures ([place])    LAST SEIZURE AGE 7   Thyroid disease     Past Surgical History:  Procedure Laterality Date   EXCISION MASS UPPER EXTREMETIES Right 04/02/2018   Procedure: EXCISION MASS RIGHT SHOULDER;  Surgeon: Benjamine Sprague, DO;  Location: [facility] ORS;  Service: General;  Laterality: Right;   LASIK     TUBAL LIGATION      Prior to Admission medications   Medication Sig Start Date End Date Taking? Authorizing Provider  Black Currant Seed Oil 500 MG CAPS Take 500 mg by mouth daily.    [provider]  calcium  carbonate (OSCAL) 1500 (600 Ca) MG TABS tablet Take 600 mg of elemental calcium by mouth daily with breakfast.    [provider]  Cranberry (RA CRANBERRY) 500 MG CAPS Take by mouth.    [provider]  cyanocobalamin 1000 MCG tablet Take by mouth.    [provider]  cyclobenzaprine (FLEXERIL) 10 MG tablet Take 1 tablet (10 mg total) by mouth 3 (three) times daily as needed for muscle spasms. Patient not taking: Reported on 03/25/2018 02/05/15   Norval Gable, MD  diclofenac sodium (VOLTAREN) 1 % GEL Apply 1 application topically daily as needed (pain).     [provider]  etodolac (LODINE) 400 MG tablet Take by mouth. 05/10/20 05/10/21  [provider]  fluticasone (FLONASE) 50 MCG/ACT nasal spray Place 1 spray into both nostrils daily as needed for allergies or rhinitis.    [provider]  gabapentin (NEURONTIN) 100 MG capsule Take by mouth. 06/17/19   [provider]  ibuprofen (ADVIL,MOTRIN) 800 MG tablet Take 1 tablet (800 mg total) by mouth every 8 (eight) hours as needed for mild pain or moderate pain. 04/02/18   Benjamine Sprague, DO  levothyroxine (SYNTHROID, LEVOTHROID) 175 MCG tablet Take 175 mcg by mouth daily before breakfast. 01/15/18   [provider]  Misc Natural  Products (COLON HERBAL CLEANSER) CAPS Take by mouth.    [provider]  ranitidine (ZANTAC) 150 MG capsule Take 150 mg by mouth daily as needed for heartburn.     [provider]  triamcinolone (NASACORT) 55 MCG/ACT AERO nasal inhaler Place into the nose.    [provider]    No family history on file.   Social History   Tobacco Use   Smoking status: Former    Packs/day: 0.25    Years: 10.00    Pack years: 2.50    Types: Cigarettes    Quit date: 11/23/2016    Years since quitting: 4.3   Smokeless tobacco: Never  Vaping Use   Vaping Use: Former  Substance Use Topics   Alcohol use: Yes    Comment: BEER OCC   Drug use:  No    Allergies as of 04/06/2021 - Review Complete 04/06/2021  Allergen Reaction Noted   Codeine Nausea Only and Other (See Comments) 02/05/2015   Penicillins Hives and Other (See Comments) 02/05/2015   Phenobarbital Other (See Comments) 03/21/2018   Vicodin [hydrocodone-acetaminophen]  03/26/2018    Review of Systems:    All systems reviewed and negative except where noted in HPI.   Physical Exam:  BP (!) 146/81   Pulse 87   Temp 98.1 F (36.7 C) (Oral)   Ht 5\' 6"  (1.676 m)   Wt 186 lb 3.2 oz (84.5 kg)   BMI 30.05 kg/m  No LMP recorded. Patient is postmenopausal. Psych:  Alert and cooperative. Normal mood and affect. General:   Alert,  Well-developed, well-nourished, pleasant and cooperative in NAD Head:  Normocephalic and atraumatic. Eyes:  Sclera clear, no icterus.   Conjunctiva pink. Ears:  Normal auditory acuity. Neurologic:  Alert and oriented x3;  grossly normal neurologically. Psych:  Alert and cooperative. Normal mood and affect.  Imaging Studies: No results found.  Assessment and Plan:   Julie Pace is a 60 y.o. y/o female has been referred for possible diarrhea but her symptoms are just increased frequency of bowel movements which may very well be secondary to consuming artificial sugars in her diet which have a laxative effect.  She is due for a screening colonoscopy.   Plan 1.  Colon cancer screening colonoscopy average risk 2.  Stop all artificial sugars and sweeteners increase dietary fiber.  Commence on Metamucil 1 tablespoon twice daily or 3 times daily.  She will inform me on the day of the procedure if it has been helping or not.   I have discussed alternative options, risks & benefits,  which include, but are not limited to, bleeding, infection, perforation,respiratory complication & drug reaction.  The patient agrees with this plan & written consent will be obtained.     Follow up in as needed  Dr Jonathon Bellows MD,MRCP(U.K)

## 2021-04-06 NOTE — Patient Instructions (Signed)
High-Fiber Eating Plan °Fiber, also called dietary fiber, is a type of carbohydrate. It is found foods such as fruits, vegetables, whole grains, and beans. A high-fiber diet can have many health benefits. Your health care provider may recommend a high-fiber diet to help: °Prevent constipation. Fiber can make your bowel movements more regular. °Lower your cholesterol. °Relieve the following conditions: °Inflammation of veins in the anus (hemorrhoids). °Inflammation of specific areas of the digestive tract (uncomplicated diverticulosis). °A problem of the large intestine, also called the colon, that sometimes causes pain and diarrhea (irritable bowel syndrome, or IBS). °Prevent overeating as part of a weight-loss plan. °Prevent heart disease, type 2 diabetes, and certain cancers. °What are tips for following this plan? °Reading food labels ° °Check the nutrition facts label on food products for the amount of dietary fiber. Choose foods that have 5 grams of fiber or more per serving. °The goals for recommended daily fiber intake include: °Men (age 50 or younger): 34-38 g. °Men (over age 50): 28-34 g. °Women (age 50 or younger): 25-28 g. °Women (over age 50): 22-25 g. °Your daily fiber goal is _____________ g. °Shopping °Choose whole fruits and vegetables instead of processed forms, such as apple juice or applesauce. °Choose a wide variety of high-fiber foods such as avocados, lentils, oats, and kidney beans. °Read the nutrition facts label of the foods you choose. Be aware of foods with added fiber. These foods often have high sugar and sodium amounts per serving. °Cooking °Use whole-grain flour for baking and cooking. °Cook with brown rice instead of white rice. °Meal planning °Start the day with a breakfast that is high in fiber, such as a cereal that contains 5 g of fiber or more per serving. °Eat breads and cereals that are made with whole-grain flour instead of refined flour or white flour. °Eat brown rice, bulgur  wheat, or millet instead of white rice. °Use beans in place of meat in soups, salads, and pasta dishes. °Be sure that half of the grains you eat each day are whole grains. °General information °You can get the recommended daily intake of dietary fiber by: °Eating a variety of fruits, vegetables, grains, nuts, and beans. °Taking a fiber supplement if you are not able to take in enough fiber in your diet. It is better to get fiber through food than from a supplement. °Gradually increase how much fiber you consume. If you increase your intake of dietary fiber too quickly, you may have bloating, cramping, or gas. °Drink plenty of water to help you digest fiber. °Choose high-fiber snacks, such as berries, raw vegetables, nuts, and popcorn. °What foods should I eat? °Fruits °Berries. Pears. Apples. Oranges. Avocado. Prunes and raisins. Dried figs. °Vegetables °Sweet potatoes. Spinach. Kale. Artichokes. Cabbage. Broccoli. Cauliflower. Green peas. Carrots. Squash. °Grains °Whole-grain breads. Multigrain cereal. Oats and oatmeal. Brown rice. Barley. Bulgur wheat. Millet. Quinoa. Bran muffins. Popcorn. Rye wafer crackers. °Meats and other proteins °Navy beans, kidney beans, and pinto beans. Soybeans. Split peas. Lentils. Nuts and seeds. °Dairy °Fiber-fortified yogurt. °Beverages °Fiber-fortified soy milk. Fiber-fortified orange juice. °Other foods °Fiber bars. °The items listed above may not be a complete list of recommended foods and beverages. Contact a dietitian for more information. °What foods should I avoid? °Fruits °Fruit juice. Cooked, strained fruit. °Vegetables °Fried potatoes. Canned vegetables. Well-cooked vegetables. °Grains °White bread. Pasta made with refined flour. White rice. °Meats and other proteins °Fatty cuts of meat. Fried chicken or fried fish. °Dairy °Milk. Yogurt. Cream cheese. Sour cream. °Fats and   oils °Butters. °Beverages °Soft drinks. °Other foods °Cakes and pastries. °The items listed above may  not be a complete list of foods and beverages to avoid. Talk with your dietitian about what choices are best for you. °Summary °Fiber is a type of carbohydrate. It is found in foods such as fruits, vegetables, whole grains, and beans. °A high-fiber diet has many benefits. It can help to prevent constipation, lower blood cholesterol, aid weight loss, and reduce your risk of heart disease, diabetes, and certain cancers. °Increase your intake of fiber gradually. Increasing fiber too quickly may cause cramping, bloating, and gas. Drink plenty of water while you increase the amount of fiber you consume. °The best sources of fiber include whole fruits and vegetables, whole grains, nuts, seeds, and beans. °This information is not intended to replace advice given to you by your health care provider. Make sure you discuss any questions you have with your health care provider. °Document Revised: 10/23/2019 Document Reviewed: 10/23/2019 °Elsevier Patient Education © 2022 Elsevier Inc. ° °

## 2021-04-19 ENCOUNTER — Encounter
Admission: RE | Disposition: A | Discharge status: Home / Self Care | Payer: Self-pay | Source: Home / Self Care | Attending: Gastroenterology

## 2021-04-19 ENCOUNTER — Ambulatory Visit: Payer: BC Managed Care – PPO | Admitting: Anesthesiology

## 2021-04-19 ENCOUNTER — Ambulatory Visit
Admission: RE | Admit: 2021-04-19 | Discharge: 2021-04-19 | Disposition: A | Discharge status: Home / Self Care | Payer: BC Managed Care – PPO | Attending: Gastroenterology | Admitting: Gastroenterology

## 2021-04-19 ENCOUNTER — Encounter: Payer: Self-pay | Admitting: Gastroenterology

## 2021-04-19 ENCOUNTER — Other Ambulatory Visit: Payer: Self-pay

## 2021-04-19 DIAGNOSIS — Z1211 Encounter for screening for malignant neoplasm of colon: Secondary | ICD-10-CM

## 2021-04-19 DIAGNOSIS — D126 Benign neoplasm of colon, unspecified: Secondary | ICD-10-CM

## 2021-04-19 DIAGNOSIS — Z888 Allergy status to other drugs, medicaments and biological substances status: Secondary | ICD-10-CM | POA: Insufficient documentation

## 2021-04-19 DIAGNOSIS — Z791 Long term (current) use of non-steroidal anti-inflammatories (NSAID): Secondary | ICD-10-CM | POA: Insufficient documentation

## 2021-04-19 DIAGNOSIS — Z79899 Other long term (current) drug therapy: Secondary | ICD-10-CM | POA: Diagnosis not present

## 2021-04-19 DIAGNOSIS — D122 Benign neoplasm of ascending colon: Secondary | ICD-10-CM | POA: Diagnosis not present

## 2021-04-19 DIAGNOSIS — D120 Benign neoplasm of cecum: Secondary | ICD-10-CM | POA: Insufficient documentation

## 2021-04-19 DIAGNOSIS — Z880 Allergy status to penicillin: Secondary | ICD-10-CM | POA: Insufficient documentation

## 2021-04-19 DIAGNOSIS — Z885 Allergy status to narcotic agent status: Secondary | ICD-10-CM | POA: Diagnosis not present

## 2021-04-19 DIAGNOSIS — Z87891 Personal history of nicotine dependence: Secondary | ICD-10-CM | POA: Diagnosis not present

## 2021-04-19 HISTORY — PX: COLONOSCOPY WITH PROPOFOL: SHX5780

## 2021-04-19 SURGERY — COLONOSCOPY WITH PROPOFOL
Anesthesia: General

## 2021-04-19 MED ORDER — DEXMEDETOMIDINE HCL IN NACL 200 MCG/50ML IV SOLN
INTRAVENOUS | Status: DC | PRN
  Administered 2021-04-19: 4 ug via INTRAVENOUS

## 2021-04-19 MED ORDER — PROPOFOL 500 MG/50ML IV EMUL
INTRAVENOUS | Status: DC | PRN
  Administered 2021-04-19: 120 ug/kg/min via INTRAVENOUS

## 2021-04-19 MED ORDER — SODIUM CHLORIDE 0.9 % IV SOLN: INTRAVENOUS | Status: DC

## 2021-04-19 MED ORDER — DEXMEDETOMIDINE HCL IN NACL 200 MCG/50ML IV SOLN
INTRAVENOUS | Status: AC
  Filled 2021-04-19: qty 50

## 2021-04-19 MED ORDER — LIDOCAINE HCL (PF) 2 % IJ SOLN
INTRAMUSCULAR | Status: AC
  Filled 2021-04-19: qty 5

## 2021-04-19 MED ORDER — PROPOFOL 500 MG/50ML IV EMUL
INTRAVENOUS | Status: AC
  Filled 2021-04-19: qty 50

## 2021-04-19 MED ORDER — ONDANSETRON HCL 4 MG/2ML IJ SOLN: 4.0000 mg | Freq: Once | INTRAMUSCULAR | Status: DC | PRN

## 2021-04-19 MED ORDER — PROPOFOL 10 MG/ML IV BOLUS
INTRAVENOUS | Status: AC
  Filled 2021-04-19: qty 20

## 2021-04-19 MED ORDER — PROPOFOL 10 MG/ML IV BOLUS
INTRAVENOUS | Status: DC | PRN
  Administered 2021-04-19: 100 mg via INTRAVENOUS

## 2021-04-19 MED ORDER — LIDOCAINE HCL (CARDIAC) PF 100 MG/5ML IV SOSY
PREFILLED_SYRINGE | INTRAVENOUS | Status: DC | PRN
  Administered 2021-04-19: 50 mg via INTRAVENOUS

## 2021-04-19 NOTE — H&P (Signed)
Jonathon Bellows, MD [address] [place], [place], [place], [address] [address], [place], [place], [place], [address] Phone: [phone]  Fax: [phone]  Primary Care Physician:  Sofie Hartigan, MD   Pre-Procedure History & Physical: HPI:  Julie Pace is a 60 y.o. female is here for an colonoscopy.   Past Medical History:  Diagnosis Date   Anxiety    Carpal tunnel syndrome    Complication of anesthesia    Depression    Family history of adverse reaction to anesthesia    PT STATES SHE THINKS HER MOM HAD REACTION TO ANESTHESIA BUT IS UNSURE WHAT REACTION WAS   GERD (gastroesophageal reflux disease)    History of methicillin resistant staphylococcus aureus (MRSA)    YEARS AGO   Hypothyroidism    PONV (postoperative nausea and vomiting)    Seizures ([place])    LAST SEIZURE AGE 61   Thyroid disease     Past Surgical History:  Procedure Laterality Date   EXCISION MASS UPPER EXTREMETIES Right 04/02/2018   Procedure: EXCISION MASS RIGHT SHOULDER;  Surgeon: Benjamine Sprague, DO;  Location: [facility] ORS;  Service: General;  Laterality: Right;   LASIK     TUBAL LIGATION      Prior to Admission medications   Medication Sig Start Date End Date Taking? Authorizing Provider  Cranberry 500 MG CAPS Take by mouth.    [provider]  ibuprofen (ADVIL,MOTRIN) 800 MG tablet Take 1 tablet (800 mg total) by mouth every 8 (eight) hours as needed for mild pain or moderate pain. 04/02/18   Benjamine Sprague, DO  levothyroxine (SYNTHROID, LEVOTHROID) 175 MCG tablet Take 175 mcg by mouth daily before breakfast. 01/15/18   [provider]  polyethylene glycol-electrolytes (NULYTELY) 420 g solution Prepare according to package instructions. Starting at 5:00 PM: Drink one 8 oz glass of mixture every 15 minutes until you finish half of the jug. Five hours prior to procedure, drink 8 oz glass of mixture every 15 minutes until it is all gone. Make sure you do not drink anything  4 hours prior to your procedure. 04/06/21   Jonathon Bellows, MD  triamcinolone (NASACORT) 55 MCG/ACT AERO nasal inhaler Place into the nose.    [provider]    Allergies as of 04/06/2021 - Review Complete 04/06/2021  Allergen Reaction Noted   Codeine Nausea Only and Other (See Comments) 02/05/2015   Penicillins Hives and Other (See Comments) 02/05/2015   Phenobarbital Other (See Comments) 03/21/2018   Vicodin [hydrocodone-acetaminophen]  03/26/2018    No family history on file.  Social History   Socioeconomic History   Marital status: Married    Spouse name: Not on file   Number of children: Not on file   Years of education: Not on file   Highest education level: Not on file  Occupational History   Not on file  Tobacco Use   Smoking status: Former    Packs/day: 0.25    Years: 10.00    Pack years: 2.50    Types: Cigarettes    Quit date: 11/23/2016    Years since quitting: 4.4   Smokeless tobacco: Never  Vaping Use   Vaping Use: Former  Substance and Sexual Activity   Alcohol use: Yes    Comment: BEER OCC   Drug use: No   Sexual activity: Not on file  Other Topics Concern   Not on file  Social History Narrative   Not on file   Social  Determinants of Health   Financial Resource Strain: Not on file  Food Insecurity: Not on file  Transportation Needs: Not on file  Physical Activity: Not on file  Stress: Not on file  Social Connections: Not on file  Intimate Partner Violence: Not on file    Review of Systems: See HPI, otherwise negative ROS  Physical Exam: There were no vitals taken for this visit. General:   Alert,  pleasant and cooperative in NAD Head:  Normocephalic and atraumatic. Neck:  Supple; no masses or thyromegaly. Lungs:  Clear throughout to auscultation, normal respiratory effort.    Heart:  +S1, +S2, Regular rate and rhythm, No edema. Abdomen:  Soft, nontender and nondistended. Normal bowel sounds, without guarding, and without rebound.    Neurologic:  Alert and  oriented x4;  grossly normal neurologically.  Impression/Plan: Julie Pace is here for an colonoscopy to be performed for Screening colonoscopy average risk   Risks, benefits, limitations, and alternatives regarding  colonoscopy have been reviewed with the patient.  Questions have been answered.  All parties agreeable.   Jonathon Bellows, MD  04/19/2021, 8:02 AM

## 2021-04-19 NOTE — Transfer of Care (Signed)
Immediate Anesthesia Transfer of Care Note  Patient: Julie Pace  Procedure(s) Performed: COLONOSCOPY WITH PROPOFOL  Patient Location: PACU and Endoscopy Unit  Anesthesia Type:General  Level of Consciousness: drowsy and patient cooperative  Airway & Oxygen Therapy: Patient Spontanous Breathing  Post-op Assessment: Report given to RN and Post -op Vital signs reviewed and stable  Post vital signs: Reviewed and stable  Last Vitals:  Vitals Value Taken Time  BP 97/55 04/19/21 0910  Temp    Pulse 76 04/19/21 0913  Resp 20 04/19/21 0913  SpO2 96 % 04/19/21 0913  Vitals shown include unvalidated device data.  Last Pain:  Vitals:   04/19/21 0900  TempSrc: Temporal  PainSc:          Complications: No notable events documented.

## 2021-04-19 NOTE — Anesthesia Preprocedure Evaluation (Signed)
Anesthesia Evaluation  Patient identified by MRN, date of birth, ID band Patient awake    Reviewed: Allergy & Precautions, NPO status , Patient's Chart, lab work & pertinent test results  History of Anesthesia Complications (+) PONV and history of anesthetic complications  Airway Mallampati: II   Neck ROM: Full    Dental no notable dental hx.    Pulmonary Current Smoker (1/2 ppd) and Patient abstained from smoking.,    Pulmonary exam normal breath sounds clear to auscultation       Cardiovascular Exercise Tolerance: Good negative cardio ROS Normal cardiovascular exam Rhythm:Regular Rate:Normal     Neuro/Psych Seizures - (last age 89),  PSYCHIATRIC DISORDERS Anxiety Depression    GI/Hepatic GERD  ,  Endo/Other  Hypothyroidism   Renal/GU negative Renal ROS     Musculoskeletal   Abdominal   Peds  Hematology negative hematology ROS (+)   Anesthesia Other Findings   Reproductive/Obstetrics                             Anesthesia Physical Anesthesia Plan  ASA: 2  Anesthesia Plan: General   Post-op Pain Management:    Induction: Intravenous  PONV Risk Score and Plan: 4 or greater and Propofol infusion, TIVA and Treatment may vary due to age or medical condition  Airway Management Planned: Natural Airway  Additional Equipment:   Intra-op Plan:   Post-operative Plan:   Informed Consent: I have reviewed the patients History and Physical, chart, labs and discussed the procedure including the risks, benefits and alternatives for the proposed anesthesia with the patient or authorized representative who has indicated his/her understanding and acceptance.       Plan Discussed with: CRNA  Anesthesia Plan Comments:         Anesthesia Quick Evaluation

## 2021-04-19 NOTE — Op Note (Signed)
[facility] Gastroenterology Patient Name: Julie Pace Procedure Date: 04/19/2021 8:06 AM MRN: [id number] Account #: [id number] Date of Birth: 1961-05-05 Admit Type: Outpatient Age: 60 Room: [facility] ENDO ROOM 2 Gender: Female Note Status: Finalized Instrument Name: Jasper Riling [id number] Procedure:             Colonoscopy Indications:           Screening for colorectal malignant neoplasm Providers:             Jonathon Bellows MD, MD Referring MD:          Sofie Hartigan (Referring MD) Medicines:             Monitored Anesthesia Care Complications:         No immediate complications. Procedure:             Pre-Anesthesia Assessment:                        - Prior to the procedure, a History and Physical was                         performed, and patient medications, allergies and                         sensitivities were reviewed. The patient's tolerance                         of previous anesthesia was reviewed.                        - The risks and benefits of the procedure and the                         sedation options and risks were discussed with the                         patient. All questions were answered and informed                         consent was obtained.                        - ASA Grade Assessment: II - A patient with mild                         systemic disease.                        After obtaining informed consent, the colonoscope was                         passed under direct vision. Throughout the procedure,                         the patient's blood pressure, pulse, and oxygen                         saturations were monitored continuously. The                         Colonoscope was introduced  through the anus and                         advanced to the the cecum, identified by the                         appendiceal orifice. The colonoscopy was performed                         with ease. The patient tolerated the procedure  well.                         The quality of the bowel preparation was excellent. Findings:      The perianal and digital rectal examinations were normal.      Two sessile polyps were found in the ascending colon. The polyps were 4       to 5 mm in size. These polyps were removed with a cold snare. Resection       and retrieval were complete.      A 10 mm polyp was found in the cecum. The polyp was sessile.      Normal mucosa was found in the entire colon. Biopsies for histology were       taken with a cold forceps from the entire colon for evaluation of       microscopic colitis.      The exam was otherwise without abnormality on direct and retroflexion       views. Impression:            - Two 4 to 5 mm polyps in the ascending colon, removed                         with a cold snare. Resected and retrieved.                        - One 10 mm polyp in the cecum.                        - Normal mucosa in the entire examined colon. Biopsied.                        - The examination was otherwise normal on direct and                         retroflexion views. Recommendation:        - Discharge patient to home (with escort).                        - Resume previous diet.                        - Continue present medications.                        - Await pathology results.                        - Repeat colonoscopy for surveillance based on  pathology results.                        - Return to my office as previously scheduled. Procedure Code(s):     --- Professional ---                        [phone], Colonoscopy, flexible; with removal of                         tumor(s), polyp(s), or other lesion(s) by snare                         technique                        45380, 67, Colonoscopy, flexible; with biopsy, single                         or multiple Diagnosis Code(s):     --- Professional ---                        K63.5, Polyp of colon                         Z12.11, Encounter for screening for malignant neoplasm                         of colon CPT copyright 2019 [employer]. All rights reserved. The codes documented in this report are preliminary and upon coder review may  be revised to meet current compliance requirements. Jonathon Bellows, MD Jonathon Bellows MD, MD 04/19/2021 9:04:44 AM This report has been signed electronically. Number of Addenda: 0 Note Initiated On: 04/19/2021 8:06 AM Scope Withdrawal Time: 0 hours 17 minutes 58 seconds  Total Procedure Duration: 0 hours 20 minutes 28 seconds  Estimated Blood Loss:  Estimated blood loss: none.      [facility]

## 2021-04-19 NOTE — Anesthesia Postprocedure Evaluation (Signed)
Anesthesia Post Note  Patient: Zettie Gootee  Procedure(s) Performed: COLONOSCOPY WITH PROPOFOL  Patient location during evaluation: PACU Anesthesia Type: General Level of consciousness: awake and alert, oriented and patient cooperative Pain management: pain level controlled Vital Signs Assessment: post-procedure vital signs reviewed and stable Respiratory status: spontaneous breathing, nonlabored ventilation and respiratory function stable Cardiovascular status: blood pressure returned to baseline and stable Postop Assessment: adequate PO intake Anesthetic complications: no   No notable events documented.   Last Vitals:  Vitals:   04/19/21 0900 04/19/21 0910  BP: (!) 97/55 (!) 97/55  Pulse: 66 71  Resp: 16 19  Temp: (!) 36.1 C   SpO2: 98% 97%    Last Pain:  Vitals:   04/19/21 0900  TempSrc: Temporal  PainSc:                  Darrin Nipper

## 2021-04-20 ENCOUNTER — Encounter: Payer: Self-pay | Admitting: Gastroenterology

## 2021-04-20 LAB — SURGICAL PATHOLOGY

## 2021-04-21 ENCOUNTER — Encounter: Payer: Self-pay | Admitting: Gastroenterology

## 2021-08-12 ENCOUNTER — Ambulatory Visit
Admission: EM | Admit: 2021-08-12 | Discharge: 2021-08-12 | Disposition: A | Discharge status: Home / Self Care | Payer: BC Managed Care – PPO | Attending: Emergency Medicine | Admitting: Emergency Medicine

## 2021-08-12 ENCOUNTER — Other Ambulatory Visit: Payer: Self-pay

## 2021-08-12 DIAGNOSIS — M5441 Lumbago with sciatica, right side: Secondary | ICD-10-CM | POA: Diagnosis not present

## 2021-08-12 MED ORDER — BACLOFEN 10 MG PO TABS: 10.0000 mg | ORAL_TABLET | Freq: Three times a day (TID) | ORAL | 0 refills | Status: DC

## 2021-08-12 MED ORDER — PREDNISONE 10 MG (21) PO TBPK: ORAL_TABLET | ORAL | 0 refills | Status: DC

## 2021-08-12 NOTE — ED Triage Notes (Signed)
Patient here for "Back Pain". [place], once sat down after getting done, getting up was painful, now sitting is painful". When having BM "some pain/pressure and numbness in abd area".

## 2021-08-12 NOTE — Discharge Instructions (Signed)
Take the Prednisone as directed.  Take the baclofen, 10 mg every 8 hours, on a schedule for the next 48 hours and then as needed.  Apply moist heat to your back for 30 minutes at a time 2-3 times a day to improve blood flow to the area and help remove the lactic acid causing the spasm.  Follow the back exercises given at discharge.  Contact your PMR physician regarding your low back pain and numbness in your vaginal area as this could indicate your protruding disc is putting pressure on your spine.  If you develop a return of numbness in your vaginal area or difficulty walking you need to go to the ER immediately.

## 2021-08-12 NOTE — ED Provider Notes (Signed)
[facility]    CSN: [id number] Arrival date & time: 08/12/21  1204      History   Chief Complaint Chief Complaint  Patient presents with   Back Pain    HPI Julie Pace is a 61 y.o. female.   HPI  61 year old female here for evaluation of back pain.  Patient reports that she was pulling branches from under a bridge a day or so ago and then sat down.  After trying to get up she experienced pain in her low back and now reports that sitting is painful.  She also reports that she had some numbness to her vaginal area that lasted approximately 30 seconds to a minute after using the bathroom earlier today.  The pain is in the middle of her back and over to the right side radiating into her right hip.  She does have a history of protruding discs at the level of L3-L4.  She denies any current numbness, tingling, or weakness in any of her legs.  She also denies any trouble with walking.  Past Medical History:  Diagnosis Date   Anxiety    Carpal tunnel syndrome    Complication of anesthesia    Depression    Family history of adverse reaction to anesthesia    PT STATES SHE THINKS HER MOM HAD REACTION TO ANESTHESIA BUT IS UNSURE WHAT REACTION WAS   GERD (gastroesophageal reflux disease)    History of methicillin resistant staphylococcus aureus (MRSA)    YEARS AGO   Hypothyroidism    PONV (postoperative nausea and vomiting)    Seizures (HCC)    LAST SEIZURE AGE 65   Thyroid disease     There are no problems to display for this patient.   Past Surgical History:  Procedure Laterality Date   COLONOSCOPY WITH PROPOFOL N/A 04/19/2021   Procedure: COLONOSCOPY WITH PROPOFOL;  Surgeon: Jonathon Bellows, MD;  Location: [facility] ENDOSCOPY;  Service: Gastroenterology;  Laterality: N/A;   EXCISION MASS UPPER EXTREMETIES Right 04/02/2018   Procedure: EXCISION MASS RIGHT SHOULDER;  Surgeon: Benjamine Sprague, DO;  Location: [facility] ORS;  Service: General;  Laterality: Right;   LASIK      TUBAL LIGATION      OB History   No obstetric history on file.      Home Medications    Prior to Admission medications   Medication Sig Start Date End Date Taking? Authorizing Provider  baclofen (LIORESAL) 10 MG tablet Take 1 tablet (10 mg total) by mouth 3 (three) times daily. 08/12/21  Yes Margarette Canada, NP  levothyroxine (SYNTHROID, LEVOTHROID) 175 MCG tablet Take 175 mcg by mouth daily before breakfast. 01/15/18  Yes [provider]  predniSONE (STERAPRED UNI-PAK 21 TAB) 10 MG (21) TBPK tablet Take 6 tablets on day 1, 5 tablets day 2, 4 tablets day 3, 3 tablets day 4, 2 tablets day 5, 1 tablet day 6 08/12/21  Yes Margarette Canada, NP  Cranberry 500 MG CAPS Take by mouth.    [provider]  polyethylene glycol-electrolytes (NULYTELY) 420 g solution Prepare according to package instructions. Starting at 5:00 PM: Drink one 8 oz glass of mixture every 15 minutes until you finish half of the jug. Five hours prior to procedure, drink 8 oz glass of mixture every 15 minutes until it is all gone. Make sure you do not drink anything 4 hours prior to your procedure. 04/06/21   Jonathon Bellows, MD  triamcinolone (NASACORT) 55 MCG/ACT AERO nasal inhaler Place into the  nose.    [provider]    Family History No family history on file.  Social History Social History   Tobacco Use   Smoking status: Every Day    Packs/day: 0.25    Years: 10.00    Pack years: 2.50    Types: Cigarettes    Last attempt to quit: 11/23/2016    Years since quitting: 4.7   Smokeless tobacco: Never  Vaping Use   Vaping Use: Never used  Substance Use Topics   Alcohol use: Yes    Comment: BEER OCC   Drug use: No     Allergies   Codeine, Penicillins, Phenobarbital, and Vicodin [hydrocodone-acetaminophen]   Review of Systems Review of Systems  Genitourinary:  Negative for difficulty urinating.  Musculoskeletal:  Positive for back pain. Negative for gait problem.  Neurological:   Positive for numbness. Negative for weakness.  Hematological: Negative.   Psychiatric/Behavioral: Negative.      Physical Exam Triage Vital Signs ED Triage Vitals  Enc Vitals Group     BP 08/12/21 1231 121/79     Pulse Rate 08/12/21 1231 75     Resp 08/12/21 1231 18     Temp 08/12/21 1231 97.8 F (36.6 C)     Temp Source 08/12/21 1231 Oral     SpO2 08/12/21 1231 100 %     Weight 08/12/21 1231 186 lb 1.1 oz (84.4 kg)     Height 08/12/21 1231 5\' 6"  (1.676 m)     Head Circumference --      Peak Flow --      Pain Score 08/12/21 1230 10     Pain Loc --      Pain Edu? --      Excl. in [place]? --    No data found.  Updated Vital Signs BP 121/79 (BP Location: Left Arm) Comment: Initial   Pulse 75    Temp 97.8 F (36.6 C) (Oral)    Resp 18    Ht 5\' 6"  (1.676 m)    Wt 186 lb 1.1 oz (84.4 kg)    SpO2 100%    BMI 30.03 kg/m   Visual Acuity Right Eye Distance:   Left Eye Distance:   Bilateral Distance:    Right Eye Near:   Left Eye Near:    Bilateral Near:     Physical Exam Vitals and nursing note reviewed.  Constitutional:      General: She is not in acute distress.    Appearance: Normal appearance. She is not ill-appearing.  HENT:     Head: Normocephalic and atraumatic.  Cardiovascular:     Rate and Rhythm: Normal rate and regular rhythm.     Pulses: Normal pulses.     Heart sounds: Normal heart sounds. No murmur heard.   No friction rub. No gallop.  Pulmonary:     Effort: Pulmonary effort is normal.     Breath sounds: Normal breath sounds. No wheezing, rhonchi or rales.  Musculoskeletal:        General: Tenderness present. No deformity.  Skin:    General: Skin is warm and dry.     Findings: No erythema or rash.  Neurological:     General: No focal deficit present.     Mental Status: She is alert and oriented to person, place, and time.     Sensory: No sensory deficit.     Motor: No weakness.     Gait: Gait normal.     Deep Tendon Reflexes: Reflexes  normal.   Psychiatric:        Mood and Affect: Mood normal.        Behavior: Behavior normal.        Thought Content: Thought content normal.        Judgment: Judgment normal.     UC Treatments / Results  Labs (all labs ordered are listed, but only abnormal results are displayed) Labs Reviewed - No data to display  EKG   Radiology No results found.  Procedures Procedures (including critical care time)  Medications Ordered in UC Medications - No data to display  Initial Impression / Assessment and Plan / UC Course  I have reviewed the triage vital signs and the nursing notes.  Pertinent labs & imaging results that were available during my care of the patient were reviewed by me and considered in my medical decision making (see chart for details).  Patient is a very pleasant, nontoxic-appearing 61 year old female here for evaluation of low back pain.  Patient has a history of low back pain and is followed by Dr. Sharlet Salina at [facility].  On 2 February she did receive bilateral S1 transforaminal epidural injections under fluoroscopy.  At that time she was experiencing acute on chronic low back pain with radiation into the right buttock diffusely through the thigh, anterior and lateral aspects of the lower leg, dorsum of the foot, and first and second toes.  At that time her symptoms were consistent with L4-L5 polyradiculitis.  Her last MRI was 09/22/2019 which showed L5-S1 shallow disc bulging with mild bilateral facet arthropathy, right greater than left.  No central foraminal stenosis.  L4-L5 disc desiccation with central disc protrusion and moderate bilateral facet arthropathy and ligamentum flavum hypertrophy.  Severe central stenosis with severe right and moderate left foraminal stenosis.  L3-L4 left-sided disc bulge with moderate bilateral facet arthropathy.  Moderate to severe central stenosis.  No foraminal stenosis.  L2-L3 well-maintained disc height with mild bilateral facet  arthropathy.  No central foraminal stenosis at that level.  Patient symptoms today are centered in her central lumbar spine that radiate into her right hip.  They do not extend down into her thigh, lower leg, or foot.  She did have approximately 30 seconds worth of perineal anesthesia that resolved and remained resolved.  On physical exam patient has a normal axial carriage.  Her cardiopulmonary exam feels clung sounds in all fields.  Patient has no tenderness with palpation of the thoracic or lumbar bony prominences of the spine.  There is very mild lower lumbar upper sacral muscle tension and tenderness to palpation.  The tenderness does not extend into the buttock or down the outside of the leg.  There is no similar findings laterally.  Patient is lower extremity strength is 5/5 in her lower extremity DTRs are 2+.  Patient does have a positive seated straight leg raise on the right and a contralateral seated straight leg raise on the left.  Following a seated exam assisted the patient to stand and reviewed her ambulation.  She has a normal tandem gait.  I also had her walk on her heels and toes which she was able to do without difficulty.  Following the exam she sat in the chair and after a few minutes she complained of numbness in her left leg.  She was assisted to stand and the numbness improved.  Her gait remained stable and tandem.  There is concern, given her lumbar pathology, that she has worsening of her disc  protrusion and possible narrowing of her canal.  I have recommended that she follow-up with her doctor at [facility] for a new MRI of her lumbar spine.  I will start her on a prednisone Dosepak taper as well as baclofen.  I have cautioned the patient that she has a return of perineal or saddle anesthesia, or difficulties walking that she needs to go to the emergency department immediately.   Final Clinical Impressions(s) / UC Diagnoses   Final diagnoses:  Acute right-sided low back pain with  right-sided sciatica     Discharge Instructions      Take the Prednisone as directed.  Take the baclofen, 10 mg every 8 hours, on a schedule for the next 48 hours and then as needed.  Apply moist heat to your back for 30 minutes at a time 2-3 times a day to improve blood flow to the area and help remove the lactic acid causing the spasm.  Follow the back exercises given at discharge.  Contact your PMR physician regarding your low back pain and numbness in your vaginal area as this could indicate your protruding disc is putting pressure on your spine.  If you develop a return of numbness in your vaginal area or difficulty walking you need to go to the ER immediately.     ED Prescriptions     Medication Sig Dispense Auth. Provider   predniSONE (STERAPRED UNI-PAK 21 TAB) 10 MG (21) TBPK tablet Take 6 tablets on day 1, 5 tablets day 2, 4 tablets day 3, 3 tablets day 4, 2 tablets day 5, 1 tablet day 6 21 tablet Margarette Canada, NP   baclofen (LIORESAL) 10 MG tablet Take 1 tablet (10 mg total) by mouth 3 (three) times daily. 35 each Margarette Canada, NP      PDMP not reviewed this encounter.   Margarette Canada, NP 08/12/21 1424

## 2021-08-25 ENCOUNTER — Other Ambulatory Visit: Payer: Self-pay

## 2021-08-25 ENCOUNTER — Other Ambulatory Visit: Payer: Self-pay | Admitting: Physical Medicine and Rehabilitation

## 2021-08-25 DIAGNOSIS — M5416 Radiculopathy, lumbar region: Secondary | ICD-10-CM

## 2021-08-27 ENCOUNTER — Ambulatory Visit
Admission: RE | Admit: 2021-08-27 | Discharge: 2021-08-27 | Disposition: A | Discharge status: Home / Self Care | Payer: BC Managed Care – PPO | Source: Ambulatory Visit | Attending: Physical Medicine and Rehabilitation | Admitting: Physical Medicine and Rehabilitation

## 2021-08-27 DIAGNOSIS — M5416 Radiculopathy, lumbar region: Secondary | ICD-10-CM

## 2021-09-15 ENCOUNTER — Other Ambulatory Visit: Payer: Self-pay | Admitting: Neurosurgery

## 2021-09-23 ENCOUNTER — Other Ambulatory Visit: Payer: Self-pay

## 2021-09-23 ENCOUNTER — Encounter
Admission: RE | Admit: 2021-09-23 | Discharge: 2021-09-23 | Disposition: A | Discharge status: Home / Self Care | Payer: BC Managed Care – PPO | Source: Ambulatory Visit | Attending: Neurosurgery | Admitting: Neurosurgery

## 2021-09-23 DIAGNOSIS — Z01812 Encounter for preprocedural laboratory examination: Secondary | ICD-10-CM | POA: Diagnosis present

## 2021-09-23 LAB — BASIC METABOLIC PANEL
Anion gap: 9 (ref 5–15)
BUN: 14 mg/dL (ref 8–23)
CO2: 25 mmol/L (ref 22–32)
Calcium: 9.1 mg/dL (ref 8.9–10.3)
Chloride: 106 mmol/L (ref 98–111)
Creatinine, Ser: 0.84 mg/dL (ref 0.44–1.00)
GFR, Estimated: >60 mL/min (ref >60)
Glucose, Bld: 92 mg/dL (ref 70–99)
Potassium: 4.2 mmol/L (ref 3.5–5.1)
Sodium: 140 mmol/L (ref 135–145)

## 2021-09-23 LAB — URINALYSIS, ROUTINE W REFLEX MICROSCOPIC
Bilirubin Urine: NEGATIVE
Glucose, UA: NEGATIVE mg/dL
Hgb urine dipstick: NEGATIVE
Ketones, ur: NEGATIVE mg/dL
Leukocytes,Ua: NEGATIVE
Nitrite: NEGATIVE
Protein, ur: NEGATIVE mg/dL
Specific Gravity, Urine: 1.015 (ref 1.005–1.030)
pH: 5 (ref 5.0–8.0)

## 2021-09-23 LAB — CBC
HCT: 43.3 % (ref 36.0–46.0)
Hemoglobin: 14.4 g/dL (ref 12.0–15.0)
MCH: 30.5 pg (ref 26.0–34.0)
MCHC: 33.3 g/dL (ref 30.0–36.0)
MCV: 91.7 fL (ref 80.0–100.0)
Platelets: 332 10*3/uL (ref 150–400)
RBC: 4.72 MIL/uL (ref 3.87–5.11)
RDW: 13.3 % (ref 11.5–15.5)
WBC: 5.7 10*3/uL (ref 4.0–10.5)
nRBC: 0 % (ref 0.0–0.2)

## 2021-09-23 LAB — SURGICAL PCR SCREEN
MRSA, PCR: NEGATIVE
Staphylococcus aureus: NEGATIVE

## 2021-09-23 LAB — TYPE AND SCREEN
ABO/RH(D): O POS
Antibody Screen: NEGATIVE

## 2021-09-23 LAB — APTT: aPTT: 31 seconds (ref 24–36)

## 2021-09-23 LAB — PROTIME-INR
INR: 0.9 (ref 0.8–1.2)
Prothrombin Time: 12 seconds (ref 11.4–15.2)

## 2021-09-23 NOTE — Patient Instructions (Addendum)
?Your procedure is scheduled on: Friday September 30, 2021. ?Report to Day Surgery inside [facility] 2nd floor, stop by admissions desk before getting on elevator.  ?To find out your arrival time please call [phone] between 1PM - 3PM on Thursday March 30, 202.. ? ?Remember: Instructions that are not followed completely may result in serious medical risk,  ?up to and including death, or upon the discretion of your surgeon and anesthesiologist your  ?surgery may need to be rescheduled.  ? ?  _X__ 1. Do not eat food after midnight the night before your procedure. ?                No chewing gum or hard candies. You may drink clear liquids up to 2 hours ?                before you are scheduled to arrive for your surgery- DO not drink clear ?                liquids within 2 hours of the start of your surgery. ?                Clear Liquids include:  water, apple juice without pulp, clear Gatorade, G2 or  ?                Gatorade Zero (avoid Red/Purple/Blue), Black Coffee or Tea (Do not add ?                anything to coffee or tea). ? ?__X__2.  On the morning of surgery brush your teeth with toothpaste and water, you ?               may rinse your mouth with mouthwash if you wish.  Do not swallow any toothpaste or mouthwash. ?   ? _X__ 3.  No Alcohol for 24 hours before or after surgery. ? ? _X__ 4.  Do Not Smoke or use e-cigarettes For 24 Hours Prior to Your Surgery. ?                Do not use any chewable tobacco products for at least 6 hours prior to ?                Surgery. ? ?_X__  5.  Do not use any recreational drugs (marijuana, cocaine, heroin, ecstasy, MDMA or other) ?               For at least one week prior to your surgery.  Combination of these drugs with anesthesia ?               May have life threatening results. ? ?____  6.  Bring all medications with you on the day of surgery if instructed.  ? ?__X__  7.  Notify your doctor if there is any change in your medical condition   ?    (cold, fever, infections). ?    ?Do not wear jewelry, make-up, hairpins, clips or nail polish. ?Do not wear lotions, powders, or perfumes. You may wear deodorant. ?Do not shave 48 hours prior to surgery. Men may shave face and neck. ?Do not bring valuables to the hospital.   ? ?[facility] is not responsible for any belongings or valuables. ? ?Contacts, dentures or bridgework may not be worn into surgery. ?Leave your suitcase in the car. After surgery it may be brought to your room. ?For patients admitted to the hospital, discharge time is  determined by your ?treatment team. ?  ?Patients discharged the day of surgery will not be allowed to drive home.   ?Make arrangements for someone to be with you for the first 24 hours of your ?Same Day Discharge. ? ?  ?Please read over the following fact sheets that you were given:  ? Spine Surgery Book   ? ? ?__X__ Take these medicines the morning of surgery with A SIP OF WATER:  ? ? 1. levothyroxine (SYNTHROID, LEVOTHROID) 175 MCG  ? 2. famotidine (PEPCID) 20 MG  ? 3.  ? 4. ? 5. ? 6. ? ?____ Fleet Enema (as directed)  ? ?__X__ Use CHG Soap (or wipes) as directed ? ?____ Use Benzoyl Peroxide Gel as instructed ? ?____ Use inhalers on the day of surgery ? ?____ Stop metformin 2 days prior to surgery   ? ?____ Take 1/2 of usual insulin dose the night before surgery. No insulin the morning ?         of surgery.  ? ?____ Call your PCP, cardiologist, or Pulmonologist if taking Coumadin/Plavix/aspirin and ask when to stop before your surgery.  ? ?__X__ One Week prior to surgery- Stop Anti-inflammatories such as Ibuprofen, Aleve, Advil, Motrin, meloxicam (MOBIC), diclofenac, etodolac, ketorolac, Toradol, Daypro, piroxicam, Goody's or BC powders. OK TO USE TYLENOL IF NEEDED ?  ?__X__ Stop supplements until after surgery.   ? ?____ Bring C-Pap to the hospital.  ? ? ?If you have any questions regarding your pre-procedure instructions,  ?Please call Pre-admit Testing at [phone] ?

## 2021-09-29 MED ORDER — ORAL CARE MOUTH RINSE: 15.0000 mL | Freq: Once | OROMUCOSAL | Status: AC

## 2021-09-29 MED ORDER — LACTATED RINGERS IV SOLN: INTRAVENOUS | Status: DC

## 2021-09-29 MED ORDER — CHLORHEXIDINE GLUCONATE 0.12 % MT SOLN: 15.0000 mL | Freq: Once | OROMUCOSAL | Status: AC

## 2021-09-30 ENCOUNTER — Ambulatory Visit: Payer: BC Managed Care – PPO | Admitting: Certified Registered Nurse Anesthetist

## 2021-09-30 ENCOUNTER — Other Ambulatory Visit: Payer: Self-pay

## 2021-09-30 ENCOUNTER — Encounter: Payer: Self-pay | Admitting: Neurosurgery

## 2021-09-30 ENCOUNTER — Ambulatory Visit
Admission: RE | Admit: 2021-09-30 | Discharge: 2021-09-30 | Disposition: A | Discharge status: Home / Self Care | Payer: BC Managed Care – PPO | Attending: Neurosurgery | Admitting: Neurosurgery

## 2021-09-30 ENCOUNTER — Ambulatory Visit: Payer: BC Managed Care – PPO

## 2021-09-30 ENCOUNTER — Encounter
Admission: RE | Disposition: A | Discharge status: Home / Self Care | Payer: Self-pay | Source: Home / Self Care | Attending: Neurosurgery

## 2021-09-30 DIAGNOSIS — G834 Cauda equina syndrome: Secondary | ICD-10-CM | POA: Insufficient documentation

## 2021-09-30 DIAGNOSIS — M48062 Spinal stenosis, lumbar region with neurogenic claudication: Secondary | ICD-10-CM | POA: Insufficient documentation

## 2021-09-30 DIAGNOSIS — Z79899 Other long term (current) drug therapy: Secondary | ICD-10-CM | POA: Insufficient documentation

## 2021-09-30 HISTORY — PX: LUMBAR LAMINECTOMY/DECOMPRESSION MICRODISCECTOMY: SHX5026

## 2021-09-30 LAB — ABO/RH: ABO/RH(D): O POS

## 2021-09-30 SURGERY — LUMBAR LAMINECTOMY/DECOMPRESSION MICRODISCECTOMY 1 LEVEL
Anesthesia: General | Site: Spine Lumbar

## 2021-09-30 MED ORDER — PHENYLEPHRINE HCL-NACL 20-0.9 MG/250ML-% IV SOLN
INTRAVENOUS | Status: DC | PRN
  Administered 2021-09-30: 30 ug/min via INTRAVENOUS

## 2021-09-30 MED ORDER — PROPOFOL 500 MG/50ML IV EMUL
INTRAVENOUS | Status: DC | PRN
  Administered 2021-09-30: 50 ug/kg/min via INTRAVENOUS

## 2021-09-30 MED ORDER — METHYLPREDNISOLONE ACETATE 40 MG/ML IJ SUSP: INTRAMUSCULAR | Status: DC | PRN

## 2021-09-30 MED ORDER — PROPOFOL 10 MG/ML IV BOLUS
INTRAVENOUS | Status: DC | PRN
  Administered 2021-09-30: 160 mg via INTRAVENOUS

## 2021-09-30 MED ORDER — SODIUM CHLORIDE 0.9 % IV SOLN
2.0000 g | INTRAVENOUS | Status: DC
  Administered 2021-09-30: 2 g via INTRAVENOUS
  Filled 2021-09-30: qty 20
  Filled 2021-09-30: qty 2

## 2021-09-30 MED ORDER — ACETAMINOPHEN 10 MG/ML IV SOLN
INTRAVENOUS | Status: DC | PRN
  Administered 2021-09-30: 1000 mg via INTRAVENOUS

## 2021-09-30 MED ORDER — OXYCODONE HCL 5 MG PO TABS: 5.0000 mg | ORAL_TABLET | Freq: Once | ORAL | Status: DC | PRN

## 2021-09-30 MED ORDER — ONDANSETRON HCL 4 MG/2ML IJ SOLN: 4.0000 mg | Freq: Once | INTRAMUSCULAR | Status: DC | PRN

## 2021-09-30 MED ORDER — ACETAMINOPHEN 10 MG/ML IV SOLN: 1000.0000 mg | Freq: Once | INTRAVENOUS | Status: DC | PRN

## 2021-09-30 MED ORDER — BUPIVACAINE LIPOSOME 1.3 % IJ SUSP
INTRAMUSCULAR | Status: AC
  Filled 2021-09-30: qty 20

## 2021-09-30 MED ORDER — BUPIVACAINE-EPINEPHRINE (PF) 0.5% -1:200000 IJ SOLN
INTRAMUSCULAR | Status: DC | PRN
  Administered 2021-09-30: 4 mL

## 2021-09-30 MED ORDER — ONDANSETRON HCL 4 MG/2ML IJ SOLN
INTRAMUSCULAR | Status: DC | PRN
  Administered 2021-09-30: 4 mg via INTRAVENOUS

## 2021-09-30 MED ORDER — PROPOFOL 10 MG/ML IV BOLUS
INTRAVENOUS | Status: AC
  Filled 2021-09-30: qty 40

## 2021-09-30 MED ORDER — OXYCODONE HCL 5 MG/5ML PO SOLN: 5.0000 mg | Freq: Once | ORAL | Status: DC | PRN

## 2021-09-30 MED ORDER — ROCURONIUM BROMIDE 100 MG/10ML IV SOLN
INTRAVENOUS | Status: DC | PRN
  Administered 2021-09-30: 5 mg via INTRAVENOUS

## 2021-09-30 MED ORDER — MIDAZOLAM HCL 2 MG/2ML IJ SOLN
INTRAMUSCULAR | Status: AC
  Filled 2021-09-30: qty 2

## 2021-09-30 MED ORDER — FENTANYL CITRATE (PF) 100 MCG/2ML IJ SOLN
INTRAMUSCULAR | Status: AC
  Filled 2021-09-30: qty 2

## 2021-09-30 MED ORDER — SUCCINYLCHOLINE CHLORIDE 200 MG/10ML IV SOSY
PREFILLED_SYRINGE | INTRAVENOUS | Status: DC | PRN
  Administered 2021-09-30: 100 mg via INTRAVENOUS

## 2021-09-30 MED ORDER — REMIFENTANIL HCL 1 MG IV SOLR
INTRAVENOUS | Status: AC
  Filled 2021-09-30: qty 1000

## 2021-09-30 MED ORDER — MIDAZOLAM HCL 2 MG/2ML IJ SOLN
INTRAMUSCULAR | Status: DC | PRN
  Administered 2021-09-30: 2 mg via INTRAVENOUS

## 2021-09-30 MED ORDER — CHLORHEXIDINE GLUCONATE 0.12 % MT SOLN
OROMUCOSAL | Status: AC
  Administered 2021-09-30: 15 mL via OROMUCOSAL
  Filled 2021-09-30: qty 15

## 2021-09-30 MED ORDER — SENNA 8.6 MG PO TABS: 1.0000 | ORAL_TABLET | Freq: Every day | ORAL | 0 refills | Status: DC | PRN

## 2021-09-30 MED ORDER — KETOROLAC TROMETHAMINE 30 MG/ML IJ SOLN
INTRAMUSCULAR | Status: DC | PRN
  Administered 2021-09-30: 30 mg via INTRAVENOUS

## 2021-09-30 MED ORDER — ACETAMINOPHEN 10 MG/ML IV SOLN
INTRAVENOUS | Status: AC
  Filled 2021-09-30: qty 100

## 2021-09-30 MED ORDER — CELECOXIB 100 MG PO CAPS: 100.0000 mg | ORAL_CAPSULE | Freq: Two times a day (BID) | ORAL | 0 refills | Status: DC

## 2021-09-30 MED ORDER — 0.9 % SODIUM CHLORIDE (POUR BTL) OPTIME
TOPICAL | Status: DC | PRN
  Administered 2021-09-30: 500 mL

## 2021-09-30 MED ORDER — METHYLPREDNISOLONE ACETATE 40 MG/ML IJ SUSP
INTRAMUSCULAR | Status: AC
Start: 2021-09-30 — End: ?
  Filled 2021-09-30: qty 1

## 2021-09-30 MED ORDER — BUPIVACAINE-EPINEPHRINE (PF) 0.5% -1:200000 IJ SOLN
INTRAMUSCULAR | Status: AC
  Filled 2021-09-30: qty 30

## 2021-09-30 MED ORDER — GLYCOPYRROLATE 0.2 MG/ML IJ SOLN
INTRAMUSCULAR | Status: DC | PRN
Start: 2021-09-30 — End: 2021-09-30
  Administered 2021-09-30: .2 mg via INTRAVENOUS

## 2021-09-30 MED ORDER — LIDOCAINE HCL (CARDIAC) PF 100 MG/5ML IV SOSY
PREFILLED_SYRINGE | INTRAVENOUS | Status: DC | PRN
  Administered 2021-09-30: 10 mg via INTRAVENOUS

## 2021-09-30 MED ORDER — OXYCODONE-ACETAMINOPHEN 5-325 MG PO TABS: 1.0000 | ORAL_TABLET | ORAL | 0 refills | Status: AC | PRN

## 2021-09-30 MED ORDER — REMIFENTANIL HCL 1 MG IV SOLR
INTRAVENOUS | Status: DC | PRN
  Administered 2021-09-30: .2 ug/kg/min via INTRAVENOUS

## 2021-09-30 MED ORDER — DEXAMETHASONE SODIUM PHOSPHATE 10 MG/ML IJ SOLN
INTRAMUSCULAR | Status: DC | PRN
  Administered 2021-09-30: 5 mg via INTRAVENOUS

## 2021-09-30 MED ORDER — SODIUM CHLORIDE FLUSH 0.9 % IV SOLN
INTRAVENOUS | Status: AC
  Filled 2021-09-30: qty 20

## 2021-09-30 MED ORDER — BUPIVACAINE HCL (PF) 0.5 % IJ SOLN
INTRAMUSCULAR | Status: AC
  Filled 2021-09-30: qty 30

## 2021-09-30 MED ORDER — SODIUM CHLORIDE (PF) 0.9 % IJ SOLN
INTRAMUSCULAR | Status: DC | PRN
  Administered 2021-09-30: 40 mL via INTRAMUSCULAR

## 2021-09-30 MED ORDER — EPHEDRINE SULFATE (PRESSORS) 50 MG/ML IJ SOLN
INTRAMUSCULAR | Status: DC | PRN
  Administered 2021-09-30 (×2): 5 mg via INTRAVENOUS

## 2021-09-30 MED ORDER — FENTANYL CITRATE (PF) 100 MCG/2ML IJ SOLN: 25.0000 ug | INTRAMUSCULAR | Status: DC | PRN

## 2021-09-30 MED ORDER — FENTANYL CITRATE (PF) 100 MCG/2ML IJ SOLN
INTRAMUSCULAR | Status: DC | PRN
Start: 2021-09-30 — End: 2021-09-30
  Administered 2021-09-30 (×2): 50 ug via INTRAVENOUS

## 2021-09-30 SURGICAL SUPPLY — 53 items
BUR NEURO DRILL SOFT 3.0X3.8M (BURR) ×2 IMPLANT
CHLORAPREP W/TINT 26 (MISCELLANEOUS) ×4 IMPLANT
CNTNR SPEC 2.5X3XGRAD LEK (MISCELLANEOUS) ×1
CONT SPEC 4OZ STER OR WHT (MISCELLANEOUS) ×1
CONTAINER SPEC 2.5X3XGRAD LEK (MISCELLANEOUS) ×1 IMPLANT
COUNTER NEEDLE 20/40 LG (NEEDLE) ×2 IMPLANT
CUP MEDICINE 2OZ PLAST GRAD ST (MISCELLANEOUS) ×4 IMPLANT
DERMABOND ADVANCED (GAUZE/BANDAGES/DRESSINGS) ×1
DERMABOND ADVANCED .7 DNX12 (GAUZE/BANDAGES/DRESSINGS) ×1 IMPLANT
DRAPE C ARM PK CFD 31 SPINE (DRAPES) ×2 IMPLANT
DRAPE LAPAROTOMY 100X77 ABD (DRAPES) ×2 IMPLANT
DRAPE MICROSCOPE SPINE 48X150 (DRAPES) ×2 IMPLANT
DRAPE SURG 17X11 SM STRL (DRAPES) ×2 IMPLANT
ELECT CAUTERY BLADE TIP 2.5 (TIP) ×2
ELECT EZSTD 165MM 6.5IN (MISCELLANEOUS)
ELECT REM PT RETURN 9FT ADLT (ELECTROSURGICAL) ×2
ELECTRODE CAUTERY BLDE TIP 2.5 (TIP) ×1 IMPLANT
ELECTRODE EZSTD 165MM 6.5IN (MISCELLANEOUS) IMPLANT
ELECTRODE REM PT RTRN 9FT ADLT (ELECTROSURGICAL) ×1 IMPLANT
GAUZE 4X4 16PLY ~~LOC~~+RFID DBL (SPONGE) ×2 IMPLANT
GLOVE SURG SYN 6.5 ES PF (GLOVE) ×4 IMPLANT
GLOVE SURG SYN 6.5 PF PI (GLOVE) ×2 IMPLANT
GLOVE SURG SYN 8.5  E (GLOVE) ×3
GLOVE SURG SYN 8.5 E (GLOVE) ×3 IMPLANT
GLOVE SURG SYN 8.5 PF PI (GLOVE) ×3 IMPLANT
GLOVE SURG UNDER POLY LF SZ6.5 (GLOVE) ×2 IMPLANT
GOWN SRG LRG LVL 4 IMPRV REINF (GOWNS) ×1 IMPLANT
GOWN SRG XL LVL 3 NONREINFORCE (GOWNS) ×1 IMPLANT
GOWN STRL NON-REIN TWL XL LVL3 (GOWNS) ×1
GOWN STRL REIN LRG LVL4 (GOWNS) ×1
GRADUATE 1200CC STRL 31836 (MISCELLANEOUS) ×2 IMPLANT
GRAFT DURAGEN MATRIX 1WX1L (Tissue) ×1 IMPLANT
KIT SPINAL PRONEVIEW (KITS) ×2 IMPLANT
MANIFOLD NEPTUNE II (INSTRUMENTS) ×2 IMPLANT
MARKER SKIN DUAL TIP RULER LAB (MISCELLANEOUS) ×3 IMPLANT
NDL SAFETY ECLIPSE 18X1.5 (NEEDLE) ×1 IMPLANT
NEEDLE HYPO 18GX1.5 SHARP (NEEDLE) ×1
NEEDLE HYPO 22GX1.5 SAFETY (NEEDLE) ×2 IMPLANT
NS IRRIG 500ML POUR BTL (IV SOLUTION) ×1 IMPLANT
PACK LAMINECTOMY NEURO (CUSTOM PROCEDURE TRAY) ×2 IMPLANT
PAD ARMBOARD 7.5X6 YLW CONV (MISCELLANEOUS) ×3 IMPLANT
SURGIFLO W/THROMBIN 8M KIT (HEMOSTASIS) ×2 IMPLANT
SUT DVC VLOC 3-0 CL 6 P-12 (SUTURE) ×2 IMPLANT
SUT ETH BLK MONO 3 0 FS 1 12/B (SUTURE) ×1 IMPLANT
SUT VIC AB 0 CT1 27 (SUTURE) ×1
SUT VIC AB 0 CT1 27XCR 8 STRN (SUTURE) ×1 IMPLANT
SUT VIC AB 2-0 CT1 18 (SUTURE) ×2 IMPLANT
SYR 10ML LL (SYRINGE) ×2 IMPLANT
SYR 20ML LL LF (SYRINGE) ×2 IMPLANT
SYR 30ML LL (SYRINGE) ×4 IMPLANT
SYR 3ML LL SCALE MARK (SYRINGE) ×2 IMPLANT
TOWEL OR 17X26 4PK STRL BLUE (TOWEL DISPOSABLE) ×6 IMPLANT
TUBING CONNECTING 10 (TUBING) ×2 IMPLANT

## 2021-09-30 NOTE — Transfer of Care (Signed)
Immediate Anesthesia Transfer of Care Note ? ?Patient: Julie Pace ? ?Procedure(s) Performed: L4-5 POSTERIOR SPINAL DECOMPRESSION (Spine Lumbar) ? ?Patient Location: PACU ? ?Anesthesia Type:General ? ?Level of Consciousness: awake, drowsy and patient cooperative ? ?Airway & Oxygen Therapy: Patient Spontanous Breathing and Patient connected to face mask oxygen ? ?Post-op Assessment: Report given to RN and Post -op Vital signs reviewed and stable ? ?Post vital signs: Reviewed and stable ? ?Last Vitals:  ?Vitals Value Taken Time  ?BP 118/63 09/30/21 1456  ?Temp    ?Pulse 81 09/30/21 1500  ?Resp 21 09/30/21 1500  ?SpO2 98 % 09/30/21 1500  ?Vitals shown include unvalidated device data. ? ?Last Pain:  ?Vitals:  ? 09/30/21 1120  ?TempSrc: Oral  ?PainSc: 0-No pain  ?   ? ?  ? ?Complications: No notable events documented. ?

## 2021-09-30 NOTE — Op Note (Signed)
Indications: Julie Pace is a 61 yo female who presented with: ?Neurogenic claudication due to lumbar spinal stenosis M48.062, Cauda equina syndrome G83.4 ? ?Due to worsening urinary dysfunction, surgical intervention was recommended. ? ?Findings: severe stenosis L4-5 ? ?Preoperative Diagnosis: Neurogenic claudication due to lumbar spinal stenosis M48.062, Cauda equina syndrome G83.4 ?Postoperative Diagnosis: same ? ? ?EBL: 20 ml ?IVF: see AR ml ?Drains: none ?Disposition: Extubated and Stable to PACU ?Complications: none ? ?No foley catheter was placed. ? ? ?Preoperative Note:  ? ?Risks of surgery discussed include: infection, bleeding, stroke, coma, death, paralysis, CSF leak, nerve/spinal cord injury, numbness, tingling, weakness, complex regional pain syndrome, recurrent stenosis and/or disc herniation, vascular injury, development of instability, neck/back pain, need for further surgery, persistent symptoms, development of deformity, and the risks of anesthesia. The patient understood these risks and agreed to proceed. ? ?Operative Note:  ? ?1. L4-5 lumbar decompression including central laminectomy and bilateral medial facetectomies including foraminotomies ? ?The patient was then brought from the preoperative center with intravenous access established.  The patient underwent general anesthesia and endotracheal tube intubation, and was then rotated on the [place] rail top where all pressure points were appropriately padded.  The skin was then thoroughly cleansed.  Perioperative antibiotic prophylaxis was administered.  Sterile prep and drapes were then applied and a timeout was then observed.  C-arm was brought into the field under sterile conditions and under lateral visualization the L4-5 interspace was identified and marked. ? ?The incision was marked on the right and injected with local anesthetic. Once this was complete a 3 cm incision was opened with the use of a #10 blade knife.   ? ?The metrx tubes  were sequentially advanced and confirmed in position at L4-5. An 65m by 530mtube was locked in place to the bed side attachment.  The microscope was then sterilely brought into the field and muscle creep was hemostased with a bipolar and resected with a pituitary rongeur.  A Bovie extender was then used to expose the spinous process and lamina.  Careful attention was placed to not violate the facet capsule. A 3 mm matchstick drill bit was then used to make a hemi-laminotomy trough until the ligamentum flavum was exposed.  This was extended to the base of the spinous process and to the contralateral side to remove all the central bone from each side.  Once this was complete and the underlying ligamentum flavum was visualized, it was dissected with a curette and resected with Kerrison rongeurs.  Extensive ligamentum hypertrophy was noted, requiring a substantial amount of time and care for removal.  The dura was identified and palpated. The kerrison rongeur was then used to remove the medial facet bilaterally until no compression was noted.  A balltip probe was used to confirm decompression of the ipsilateral L5 nerve root. ? ?Additional attention was paid to completion of the contralateral L4-5 foraminotomy until the contralateral traversing nerve root was completely free.  Once this was complete, L4-5 central decompression including medial facetectomy and foraminotomy was confirmed and decompression on both sides was confirmed. No CSF leak was noted. ? ?A Depo-Medrol soaked Gelfoam pledget was placed in the defect.  The wound was copiously irrigated. The tube system was then removed under microscopic visualization and hemostasis was obtained with a bipolar.   ? ?The fascial layer was reapproximated with the use of a 0 Vicryl suture.  Subcutaneous tissue layer was reapproximated using 2-0 Vicryl suture.  3-0 monocryl was placed in subcuticular fashion. The  skin was then cleansed and Dermabond was used to close the  skin opening.  Patient was then rotated back to the preoperative bed awakened from anesthesia and taken to recovery all counts are correct in this case. ? ?I performed the entire procedure with the assistance of Cooper Render PA as an Pensions consultant. ? ?Neng Albee K. Izora Ribas MD ? ?

## 2021-09-30 NOTE — Anesthesia Procedure Notes (Signed)
Procedure Name: Intubation ?Date/Time: 09/30/2021 1:05 PM ?Performed by: Lowry Bowl, CRNA ?Pre-anesthesia Checklist: Patient identified, Emergency Drugs available, Suction available and Patient being monitored ?Patient Re-evaluated:Patient Re-evaluated prior to induction ?Oxygen Delivery Method: Circle system utilized ?Preoxygenation: Pre-oxygenation with 100% oxygen ?Induction Type: IV induction ?Ventilation: Mask ventilation without difficulty ?Laryngoscope Size: McGraph and 3 ?Grade View: Grade I ?Tube type: Oral ?Tube size: 6.5 mm ?Number of attempts: 1 ?Airway Equipment and Method: Stylet and Video-laryngoscopy ?Placement Confirmation: ETT inserted through vocal cords under direct vision, positive ETCO2 and breath sounds checked- equal and bilateral ?Tube secured with: Tape ?Dental Injury: Teeth and Oropharynx as per pre-operative assessment  ? ? ? ? ?

## 2021-09-30 NOTE — Anesthesia Preprocedure Evaluation (Signed)
Anesthesia Evaluation  ?Patient identified by MRN, date of birth, ID band ?Patient awake ? ?General Assessment Comment: ? ?Hx PONV but that was over 30 years ago ? ?Reviewed: ?Allergy & Precautions, NPO status , Patient's Chart, lab work & pertinent test results ? ?History of Anesthesia Complications ?(+) PONV and history of anesthetic complications ? ?Airway ?Mallampati: II ? ?TM Distance: >3 FB ?Neck ROM: Full ? ? ? Dental ?no notable dental hx. ?(+) Teeth Intact, Caps, Implants ?  ?Pulmonary ?neg pulmonary ROS, neg sleep apnea, neg COPD, Patient abstained from smoking.Not current smoker, former smoker,  ?  ?Pulmonary exam normal ?breath sounds clear to auscultation ? ? ? ? ? ? Cardiovascular ?Exercise Tolerance: Good ?METS(-) hypertension(-) CAD and (-) Past MI negative cardio ROS ? ?(-) dysrhythmias  ?Rhythm:Regular Rate:Normal ?- Systolic murmurs ? ?  ?Neuro/Psych ?PSYCHIATRIC DISORDERS Anxiety Depression Seizures as child, none as adult. ?negative neurological ROS ?   ? GI/Hepatic ?GERD  Medicated and Controlled,(+)  ?  ? (-) substance abuse ? ,   ?Endo/Other  ?neg diabetesHypothyroidism  ? Renal/GU ?negative Renal ROS  ? ?  ?Musculoskeletal ? ? Abdominal ?  ?Peds ? Hematology ?  ?Anesthesia Other Findings ?Past Medical History: ?No date: Anxiety ?No date: Carpal tunnel syndrome ?No date: Complication of anesthesia ?No date: Depression ?No date: Family history of adverse reaction to anesthesia ?    Comment:  PT STATES SHE THINKS HER MOM HAD REACTION TO ANESTHESIA  ?             BUT IS UNSURE WHAT REACTION WAS ?No date: GERD (gastroesophageal reflux disease) ?No date: History of methicillin resistant staphylococcus aureus (MRSA) ?    Comment:  YEARS AGO ?No date: Hypothyroidism ?No date: PONV (postoperative nausea and vomiting) ?No date: Seizures ([place]) ?    Comment:  LAST SEIZURE AGE 26 ?No date: Thyroid disease ? Reproductive/Obstetrics ? ?  ? ? ? ? ? ? ? ? ? ? ? ? ? ?  ?   ? ? ? ? ? ? ? ? ?Anesthesia Physical ?Anesthesia Plan ? ?ASA: 2 ? ?Anesthesia Plan: General  ? ?Post-op Pain Management: Ofirmev IV (intra-op)* and Toradol IV (intra-op)*  ? ?Induction: Intravenous ? ?PONV Risk Score and Plan: 4 or greater and Ondansetron, Dexamethasone and Midazolam ? ?Airway Management Planned: Oral ETT ? ?Additional Equipment: None ? ?Intra-op Plan:  ? ?Post-operative Plan: Extubation in OR ? ?Informed Consent: I have reviewed the patients History and Physical, chart, labs and discussed the procedure including the risks, benefits and alternatives for the proposed anesthesia with the patient or authorized representative who has indicated his/her understanding and acceptance.  ? ? ? ?Dental advisory given ? ?Plan Discussed with: CRNA and Surgeon ? ?Anesthesia Plan Comments: (Discussed risks of anesthesia with patient, including PONV, sore throat, lip/dental/eye damage. Rare risks discussed as well, such as cardiorespiratory and neurological sequelae, and allergic reactions. Discussed the role of CRNA in patient's perioperative care. Patient understands. ? ?Patient has listed allergy to PCN - hives/rash over 30 years ago. ?Severe blistering skin reaction (SJS/TEN)? no ?Liver or kidney injury caused by PCN? no ?Hemolytic anemia from PCN? no ?Drug fever? no ?Painful swollen joints? no ?Severe reaction involving inside of mouth, eye, or genital ulcers? no ?Based on current evidence Alfonse Alpers et al, J Allergy Clin Immunol Pract, 2019), will proceed with cefazolin use: Yes  ?)  ? ? ? ? ? ? ?Anesthesia Quick Evaluation ? ?

## 2021-09-30 NOTE — Anesthesia Postprocedure Evaluation (Signed)
Anesthesia Post Note ? ?Patient: Julie Pace ? ?Procedure(s) Performed: L4-5 POSTERIOR SPINAL DECOMPRESSION (Spine Lumbar) ? ?Patient location during evaluation: PACU ?Anesthesia Type: General ?Level of consciousness: awake and alert ?Pain management: pain level controlled ?Vital Signs Assessment: post-procedure vital signs reviewed and stable ?Respiratory status: spontaneous breathing, nonlabored ventilation, respiratory function stable and patient connected to nasal cannula oxygen ?Cardiovascular status: blood pressure returned to baseline and stable ?Postop Assessment: no apparent nausea or vomiting ?Anesthetic complications: no ? ? ?No notable events documented. ? ? ?Last Vitals:  ?Vitals:  ? 09/30/21 1540 09/30/21 1550  ?BP: (!) 102/55 122/67  ?Pulse: (!) 51 (!) 55  ?Resp: 14 18  ?Temp: (!) 36.2 ?C (!) 36.1 ?C  ?SpO2: 100% 100%  ?  ?Last Pain:  ?Vitals:  ? 09/30/21 1550  ?TempSrc: Temporal  ?PainSc: 0-No pain  ? ? ?  ?  ?  ?  ?  ?  ? ?Arita Miss ? ? ? ? ?

## 2021-09-30 NOTE — Op Note (Signed)
I have reviewed and confirmed my history and physical from 09/08/21 with no additions or changes. Plan for L4-5 decompression.  Risks and benefits reviewed. ? ?Heart sounds normal no MRG. Chest Clear to Auscultation Bilaterally. ? ? ?  ? ?

## 2021-09-30 NOTE — Discharge Instructions (Addendum)
?Your surgeon has performed an operation on your lumbar spine (low back) to relieve pressure on one or more nerves. Many times, patients feel better immediately after surgery and can ?overdo it.? Even if you feel well, it is important that you follow these activity guidelines. If you do not let your back heal properly from the surgery, you can increase the chance of a disc herniation and/or return of your symptoms. The following are instructions to help in your recovery once you have been discharged from the hospital. ? ? ?Activity  ?  ?No bending, lifting, or twisting (?BLT?). Avoid lifting objects heavier than 10 pounds (gallon milk jug).  Where possible, avoid household activities that involve lifting, bending, pushing, or pulling such as laundry, vacuuming, grocery shopping, and childcare. Try to arrange for help from friends and family for these activities while your back heals. ? ?Increase physical activity slowly as tolerated.  Taking short walks is encouraged, but avoid strenuous exercise. Do not jog, run, bicycle, lift weights, or participate in any other exercises unless specifically allowed by your doctor. Avoid prolonged sitting, including car rides. ? ?Talk to your doctor before resuming sexual activity. ? ?You should not drive until cleared by your doctor. ? ?Until released by your doctor, you should not return to work or school.  You should rest at home and let your body heal.  ? ?You may shower two days after your surgery.  After showering, lightly dab your incision dry. Do not take a tub bath or go swimming for 3 weeks, or until approved by your doctor at your follow-up appointment. ? ?If you smoke, we strongly recommend that you quit.  Smoking has been proven to interfere with normal healing in your back and will dramatically reduce the success rate of your surgery. Please contact [facility] (800-QUIT-NOW) and use the resources at www.[facility].com for assistance in stopping smoking. ? ?Surgical  Incision ?  ?If you have a dressing on your incision, you may remove it three days after your surgery. Keep your incision area clean and dry. ? ?If you have staples or stitches on your incision, you should have a follow up scheduled for removal in 10-14 days ? ?Diet          ? ? You may return to your usual diet. Be sure to stay hydrated. ? ?When to Contact us ? ?Although your surgery and recovery will likely be uneventful, you may have some residual numbness, aches, and pains in your back and/or legs. This is normal and should improve in the next few weeks. ? ?However, should you experience any of the following, contact us immediately: ?New numbness or weakness ?Pain that is progressively getting worse, and is not relieved by your pain medications or rest ?Bleeding, redness, swelling, pain, or drainage from surgical incision ?Chills or flu-like symptoms ?Fever greater than 101.0 F (38.3 C) ?Problems with bowel or bladder functions ?Difficulty breathing or shortness of breath ?Warmth, tenderness, or swelling in your calf ? ?Contact Information ?During office hours (Monday-Friday 9 am to 5 pm), please call your physician at [phone] ?After hours and weekends, please call [phone] and speak with the answering service, who will contact the doctor on call.  If that fails, call the [place] Operator at [phone] and ask for the Neurosurgery Resident On Call  ?For a life-threatening emergency, call 911  ? ? ?AMBULATORY SURGERY  ?DISCHARGE INSTRUCTIONS ? ? ?The drugs that you were given will stay in your system until tomorrow so for the next  24 hours you should not: ? ?Drive an automobile ?Make any legal decisions ?Drink any alcoholic beverage ? ? ?You may resume regular meals tomorrow.  Today it is better to start with liquids and gradually work up to solid foods. ? ?You may eat anything you prefer, but it is better to start with liquids, then soup and crackers, and gradually work up to solid foods. ? ? ?Please  notify your doctor immediately if you have any unusual bleeding, trouble breathing, redness and pain at the surgery site, drainage, fever, or pain not relieved by medication. ? ? ? ?Additional Instructions: ? ? ? ? ? ? ? ?Please contact your physician with any problems or Same Day Surgery at [phone], Monday through Friday 6 am to 4 pm, or [place] at [facility] number at [phone].  ? ? ? ?

## 2021-09-30 NOTE — Discharge Summary (Signed)
Physician Discharge Summary  ?Patient ID: ?Julie Pace ?MRN: [id number] ?DOB/AGE: 09/25/1960 61 y.o. ? ?Admit date: 09/30/2021 ?Discharge date: 09/30/2021 ? ?Admission Diagnoses: lumbar stenosis  ? ?Discharge Diagnoses:  ?Active Problems: ?  * No active hospital problems. * ? ? ?Discharged Condition: good ? ?Hospital Course:  ?Julie Pace is a 61 y.o female s/p L4-5 decompression. Her interoperative course was uncomplicated. She was monitored in PACU post-op and was discharged home after ambulating urinating and tolerating PO intake. She was given Percocet as needed for pain. ? ?Consults: None ? ?Significant Diagnostic Studies: none  ? ?Treatments: surgery: as above. Please see separately dictated operative report for further details. ? ?Discharge Exam: ?Blood pressure (!) 145/71, pulse 74, temperature 98.2 ?F (36.8 ?C), temperature source Oral, resp. rate 18, height '5\' 7"'$  (1.702 m), weight 87.5 kg, SpO2 98 %. ?CN II-XII grossly intact ?5/5 strength in bilateral lower extremities ? ?Disposition: Discharge disposition: 01-Home or Self Care ? ? ? ? ? ? ?Discharge Instructions   ? ? Diet - low sodium heart healthy   Complete by: As directed ?  ? Increase activity slowly   Complete by: As directed ?  ? ?  ? ?Allergies as of 09/30/2021   ? ?   Reactions  ? Codeine Nausea Only, Other (See Comments)  ? dizziness  ? Penicillins Hives, Other (See Comments)  ? Has patient had a PCN reaction causing immediate rash, facial/tongue/throat swelling, SOB or lightheadedness with hypotension: Yes ?Has patient had a PCN reaction causing severe rash involving mucus membranes or skin necrosis: No ?Has patient had a PCN reaction that required hospitalization: Yes ?Has patient had a PCN reaction occurring within the last 10 years: No ?If all of the above answers are "NO", then may proceed with Cephalosporin use.  ? Phenobarbital Other (See Comments)  ? Speeds pt up  ? Vicodin [hydrocodone-acetaminophen]   ? DIZZY, NAUSEATED   ? ?  ? ?  ?Medication List  ?  ? ?STOP taking these medications   ? ?ibuprofen 200 MG tablet ?Commonly known as: ADVIL ?  ?predniSONE 10 MG (21) Tbpk tablet ?Commonly known as: STERAPRED UNI-PAK 21 TAB ?  ? ?  ? ?TAKE these medications   ? ?baclofen 10 MG tablet ?Commonly known as: LIORESAL ?Take 1 tablet (10 mg total) by mouth 3 (three) times daily. ?What changed:  ?when to take this ?reasons to take this ?  ?BEE POLLEN PO ?Take 2 capsules by mouth daily. ?  ?celecoxib 100 MG capsule ?Commonly known as: CeleBREX ?Take 1 capsule (100 mg total) by mouth 2 (two) times daily. ?  ?Cranberry 500 MG Caps ?Take 500 mg by mouth daily. ?  ?famotidine 20 MG tablet ?Commonly known as: PEPCID ?Take 20 mg by mouth 2 (two) times daily. ?  ?FISH OIL PO ?Take 1 capsule by mouth daily. ?  ?levothyroxine 175 MCG tablet ?Commonly known as: SYNTHROID ?Take 175 mcg by mouth daily before breakfast. ?  ?loratadine 10 MG tablet ?Commonly known as: CLARITIN ?Take 10 mg by mouth daily as needed for allergies. ?  ?METAMUCIL FIBER PO ?Take 1 Dose by mouth daily as needed (constipation). ?  ?OVER THE COUNTER MEDICATION ?Take 1 capsule by mouth daily. Alpilean otc supplement ?  ?OVER THE COUNTER MEDICATION ?Take 2 tablets by mouth daily. vital roots otc supplement ?  ?oxyCODONE-acetaminophen 5-325 MG tablet ?Commonly known as: Percocet ?Take 1 tablet by mouth every 4 (four) hours as needed for up to 5 days for severe pain. ?  ?  senna 8.6 MG Tabs tablet ?Commonly known as: SENOKOT ?Take 1 tablet (8.6 mg total) by mouth daily as needed for mild constipation. ?  ?triamcinolone 55 MCG/ACT Aero nasal inhaler ?Commonly known as: NASACORT ?Place 2 sprays into the nose daily as needed (allergies). ?  ? ?  ? ? Follow-up Information   ? ? Loleta Dicker, PA Follow up in 2 week(s).   ?Why: for incision check. Appointment date and time is in pre-op paperwork ?Contact information: ?[place] [address] ?[phone] ? ? ?  ?  ? ?  ?   ? ?  ? ? ?Signed: ?Loleta Dicker ?09/30/2021, 2:52 PM ? ? ?

## 2022-01-26 ENCOUNTER — Other Ambulatory Visit (INDEPENDENT_AMBULATORY_CARE_PROVIDER_SITE_OTHER): Payer: Self-pay | Admitting: Nurse Practitioner

## 2022-01-26 DIAGNOSIS — I739 Peripheral vascular disease, unspecified: Secondary | ICD-10-CM

## 2022-01-27 ENCOUNTER — Ambulatory Visit (INDEPENDENT_AMBULATORY_CARE_PROVIDER_SITE_OTHER): Payer: BC Managed Care – PPO | Admitting: Nurse Practitioner

## 2022-01-27 ENCOUNTER — Encounter (INDEPENDENT_AMBULATORY_CARE_PROVIDER_SITE_OTHER): Payer: Self-pay | Admitting: Nurse Practitioner

## 2022-01-27 ENCOUNTER — Ambulatory Visit (INDEPENDENT_AMBULATORY_CARE_PROVIDER_SITE_OTHER): Payer: BC Managed Care – PPO

## 2022-01-27 DIAGNOSIS — I739 Peripheral vascular disease, unspecified: Secondary | ICD-10-CM

## 2022-01-27 NOTE — Progress Notes (Signed)
Subjective:    Patient ID: Julie Pace, female    DOB: Jun 18, 1961, 61 y.o.   MRN: [id number] Chief Complaint  Patient presents with   New Patient (Initial Visit)    Ref [facility] consult claudication    The patient presents today as a referral from Dr. Tressia Miners with concern for claudication.  The patient notes that she has significant pain with walking and ambulation as well as tingling in the leg prior to having back surgery.  The patient had known spinal stenosis.  That has largely improved post surgery.  The patient also has right-sided sciatica with plantar fasciitis and this is also under good control.  The patient is a known smoker and notes that she has been smoking more recently given being restricted due to her surgery.  She denies classic claudication-like symptoms.  She has no rest pain or open wounds or ulcerations.  She does have some concern with noted pain that pops up sometimes on her right lower extremity but it also disappears quickly.  Today noninvasive studies show a right ABI of 1.09 and a left of 1.06.  The patient has normal TBI's bilaterally.  She has triphasic tibial artery waveforms bilaterally with good toe waveforms bilaterally.    Review of Systems  Cardiovascular:  Negative for leg swelling.  Musculoskeletal:  Positive for back pain.  All other systems reviewed and are negative.      Objective:   Physical Exam Vitals reviewed.  HENT:     Head: Normocephalic.  Cardiovascular:     Rate and Rhythm: Normal rate.     Pulses:          Dorsalis pedis pulses are 1+ on the right side and 1+ on the left side.       Posterior tibial pulses are 2+ on the right side and 2+ on the left side.  Pulmonary:     Effort: Pulmonary effort is normal.  Skin:    General: Skin is warm and dry.  Neurological:     Mental Status: She is alert and oriented to person, place, and time.  Psychiatric:        Mood and Affect: Mood normal.        Behavior: Behavior  normal.        Thought Content: Thought content normal.        Judgment: Judgment normal.     BP 124/76 (BP Location: Left Arm)   Pulse 74   Resp 16   Wt 195 lb 6.4 oz (88.6 kg)   BMI 30.60 kg/m   Past Medical History:  Diagnosis Date   Anxiety    Carpal tunnel syndrome    Complication of anesthesia    Depression    Family history of adverse reaction to anesthesia    PT STATES SHE THINKS HER MOM HAD REACTION TO ANESTHESIA BUT IS UNSURE WHAT REACTION WAS   GERD (gastroesophageal reflux disease)    History of methicillin resistant staphylococcus aureus (MRSA)    YEARS AGO   Hypothyroidism    PONV (postoperative nausea and vomiting)    Seizures (HCC)    LAST SEIZURE AGE 69   Thyroid disease     Social History   Socioeconomic History   Marital status: Married    Spouse name: Not on file   Number of children: Not on file   Years of education: Not on file   Highest education level: Not on file  Occupational History   Not on file  Tobacco Use   Smoking status: Former    Packs/day: 0.50    Years: 10.00    Total pack years: 5.00    Types: Cigarettes    Quit date: 09/23/2021    Years since quitting: 0.3   Smokeless tobacco: Never  Vaping Use   Vaping Use: Never used  Substance and Sexual Activity   Alcohol use: Yes    Comment: BEER OCC   Drug use: No   Sexual activity: Not on file  Other Topics Concern   Not on file  Social History Narrative   Not on file   Social Determinants of Health   Financial Resource Strain: Not on file  Food Insecurity: Not on file  Transportation Needs: Not on file  Physical Activity: Not on file  Stress: Not on file  Social Connections: Not on file  Intimate Partner Violence: Not on file    Past Surgical History:  Procedure Laterality Date   COLONOSCOPY WITH PROPOFOL N/A 04/19/2021   Procedure: COLONOSCOPY WITH PROPOFOL;  Surgeon: Jonathon Bellows, MD;  Location: [facility] ENDOSCOPY;  Service: Gastroenterology;  Laterality: N/A;    EXCISION MASS UPPER EXTREMETIES Right 04/02/2018   Procedure: EXCISION MASS RIGHT SHOULDER;  Surgeon: Benjamine Sprague, DO;  Location: [facility] ORS;  Service: General;  Laterality: Right;   LASIK Bilateral 2008   LUMBAR LAMINECTOMY/DECOMPRESSION MICRODISCECTOMY N/A 09/30/2021   Procedure: L4-5 POSTERIOR SPINAL DECOMPRESSION;  Surgeon: Meade Maw, MD;  Location: [facility] ORS;  Service: Neurosurgery;  Laterality: N/A;   TUBAL LIGATION  1990    Family History  Problem Relation Age of Onset   Hypertension Mother    Diverticulitis Mother    Stroke Father    Diabetes Maternal Aunt    Crohn's disease Paternal Aunt    Cirrhosis Other    Lupus Other     Allergies  Allergen Reactions   Codeine Nausea Only and Other (See Comments)    dizziness   Penicillins Hives and Other (See Comments)    Has patient had a PCN reaction causing immediate rash, facial/tongue/throat swelling, SOB or lightheadedness with hypotension: Yes Has patient had a PCN reaction causing severe rash involving mucus membranes or skin necrosis: No Has patient had a PCN reaction that required hospitalization: Yes Has patient had a PCN reaction occurring within the last 10 years: No If all of the above answers are "NO", then may proceed with Cephalosporin use.    Phenobarbital Other (See Comments)    Speeds pt up   Vicodin [Hydrocodone-Acetaminophen]     DIZZY, NAUSEATED       Latest Ref Rng & Units 09/23/2021   10:18 AM  CBC  WBC 4.0 - 10.5 K/uL 5.7   Hemoglobin 12.0 - 15.0 g/dL 14.4   Hematocrit 36.0 - 46.0 % 43.3   Platelets 150 - 400 K/uL 332       CMP     Component Value Date/Time   NA 140 09/23/2021 1018   K 4.2 09/23/2021 1018   CL 106 09/23/2021 1018   CO2 25 09/23/2021 1018   GLUCOSE 92 09/23/2021 1018   BUN 14 09/23/2021 1018   CREATININE 0.84 09/23/2021 1018   CALCIUM 9.1 09/23/2021 1018   GFRNONAA >60 09/23/2021 1018     No results found.     Assessment & Plan:   1. PVD (peripheral  vascular disease) ([place]) Noninvasive studies today indicate that she does not have significant arterial disease.  Her noninvasive studies today were normal.  The patient may have some venous  disease but at this time it is not significant.  Patient will follow-up with [place] if her venous symptoms worsen or she begins to develop new arterial symptoms - VAS [place] ABI WITH/WO TBI   Current Outpatient Medications on File Prior to Visit  Medication Sig Dispense Refill   ascorbic acid (VITAMIN C) 250 MG CHEW Chew 250 mg by mouth daily.     Cholecalciferol (D3-1000) 25 MCG (1000 UT) capsule Take 1,000 Units by mouth daily.     Cranberry 500 MG CAPS Take 500 mg by mouth daily.     famotidine (PEPCID) 20 MG tablet Take 20 mg by mouth as needed.     levothyroxine (SYNTHROID, LEVOTHROID) 175 MCG tablet Take 175 mcg by mouth daily before breakfast.  1   loratadine (CLARITIN) 10 MG tablet Take 10 mg by mouth daily as needed for allergies.     METAMUCIL FIBER PO Take 1 Dose by mouth daily as needed (constipation).     Multiple Vitamin (MULTIVITAMIN) capsule Take 1 capsule by mouth daily.     Omega-3 Fatty Acids (FISH OIL PO) Take 1 capsule by mouth daily.     triamcinolone (NASACORT) 55 MCG/ACT AERO nasal inhaler Place 2 sprays into the nose daily as needed (allergies).     baclofen (LIORESAL) 10 MG tablet Take 1 tablet (10 mg total) by mouth 3 (three) times daily. (Patient not taking: Reported on 01/27/2022) 30 each 0   BEE POLLEN PO Take 2 capsules by mouth daily. (Patient not taking: Reported on 01/27/2022)     celecoxib (CELEBREX) 100 MG capsule Take 1 capsule (100 mg total) by mouth 2 (two) times daily. (Patient not taking: Reported on 01/27/2022) 60 capsule 0   OVER THE COUNTER MEDICATION Take 1 capsule by mouth daily. Alpilean otc supplement (Patient not taking: Reported on 01/27/2022)     OVER THE COUNTER MEDICATION Take 2 tablets by mouth daily. vital roots otc supplement (Patient not taking: Reported on  01/27/2022)     senna (SENOKOT) 8.6 MG TABS tablet Take 1 tablet (8.6 mg total) by mouth daily as needed for mild constipation. (Patient not taking: Reported on 01/27/2022) 30 tablet 0   No current facility-administered medications on file prior to visit.    There are no Patient Instructions on file for this visit. No follow-ups on file.   Kris Hartmann, NP

## 2022-05-01 ENCOUNTER — Encounter (INDEPENDENT_AMBULATORY_CARE_PROVIDER_SITE_OTHER): Payer: Self-pay

## 2022-08-11 IMAGING — RF DG LUMBAR SPINE 2-3V
1 series · 4 of 4 positions shown · non-contrast
Comparison: None.

FLUOROSCOPY:
Air kerma 5.2 mGy

CLINICAL DATA: L4-L5 decompression

EXAM:
LUMBAR SPINE - 2-3 VIEW; DG C-ARM 1-60 MIN-NO REPORT

[Series 1: dg x-ray · 0.20mm/px · 4 of 4 slices shown]
[im 1/4]
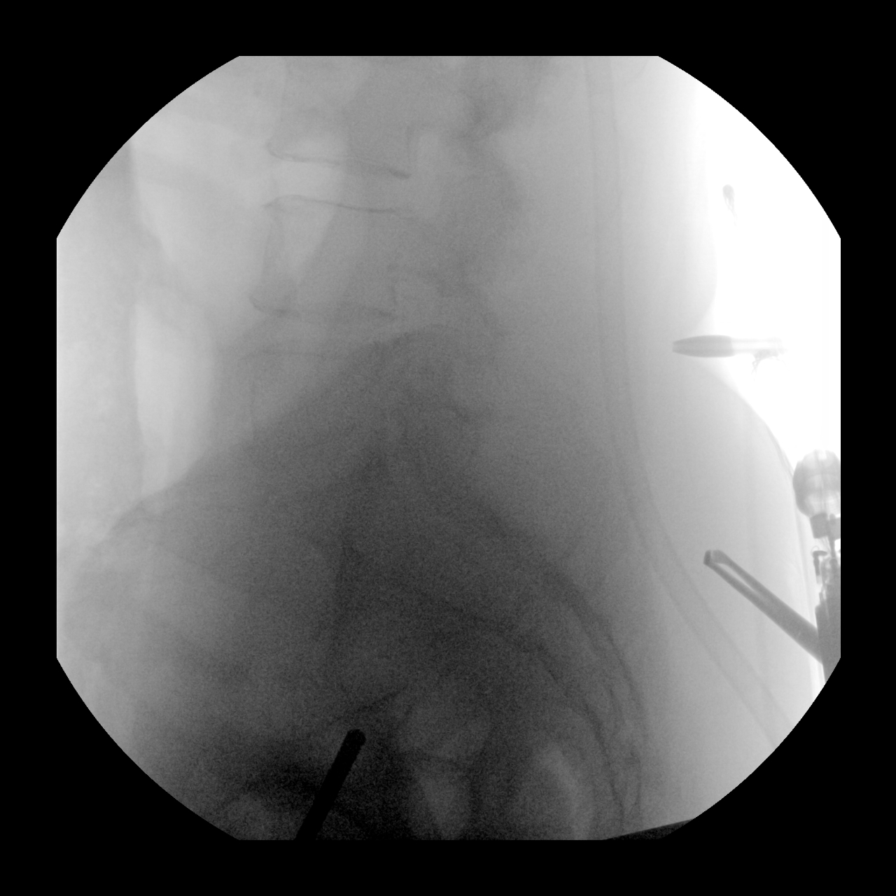
[im 2/4]
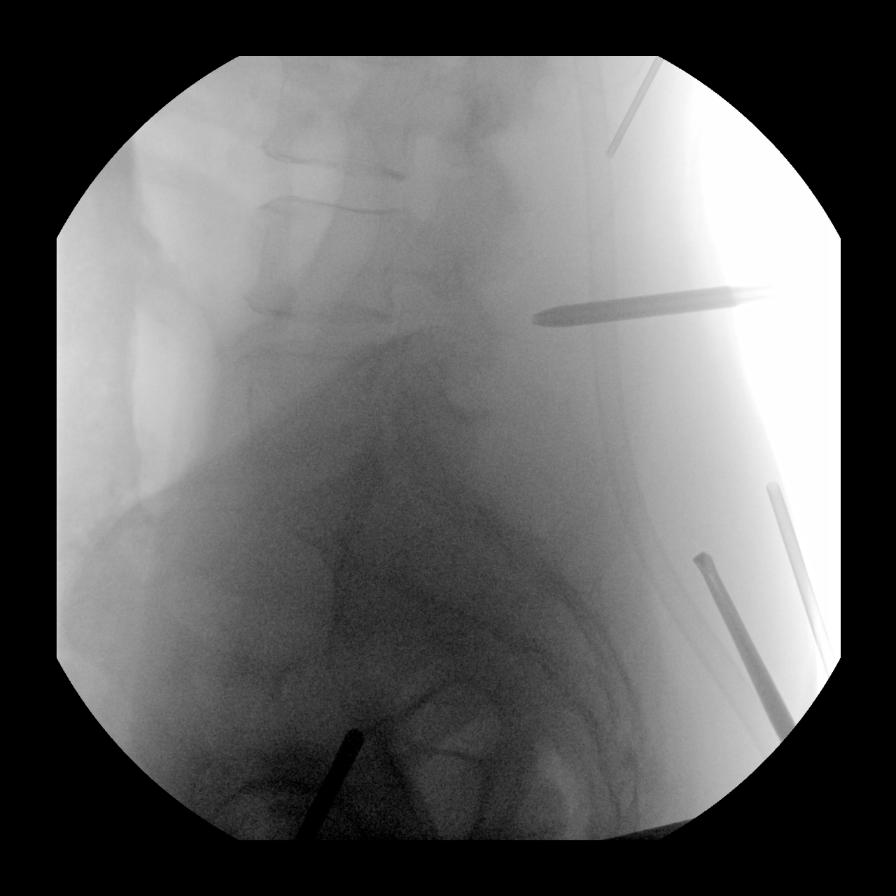
[im 3/4]
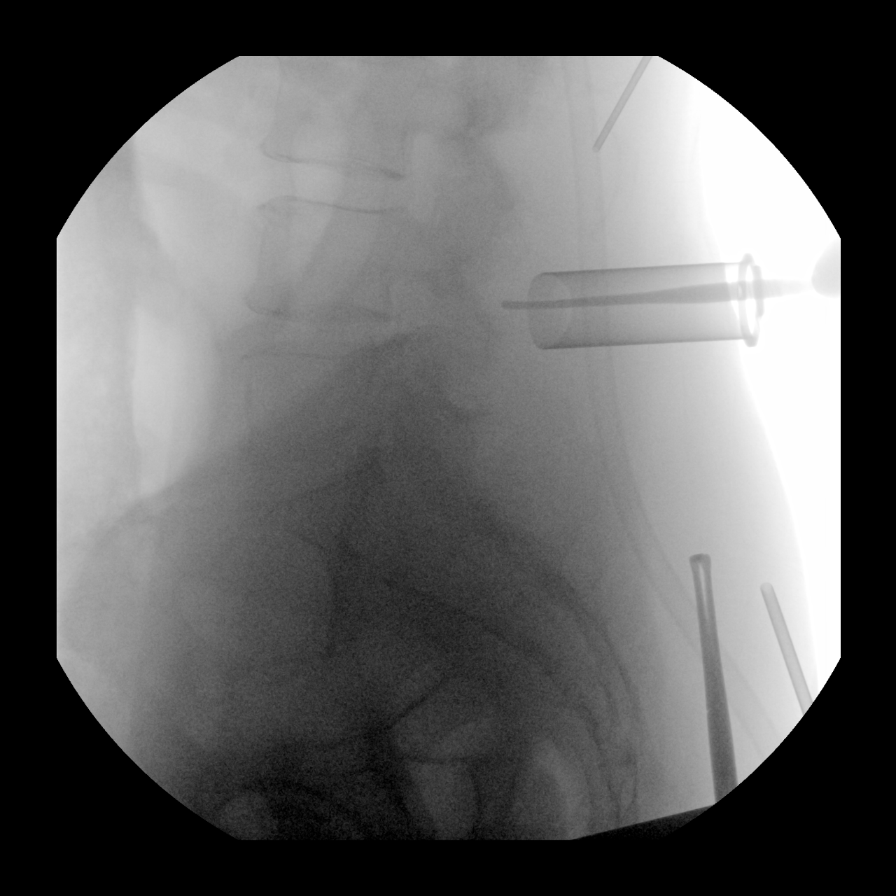
[im 4/4]
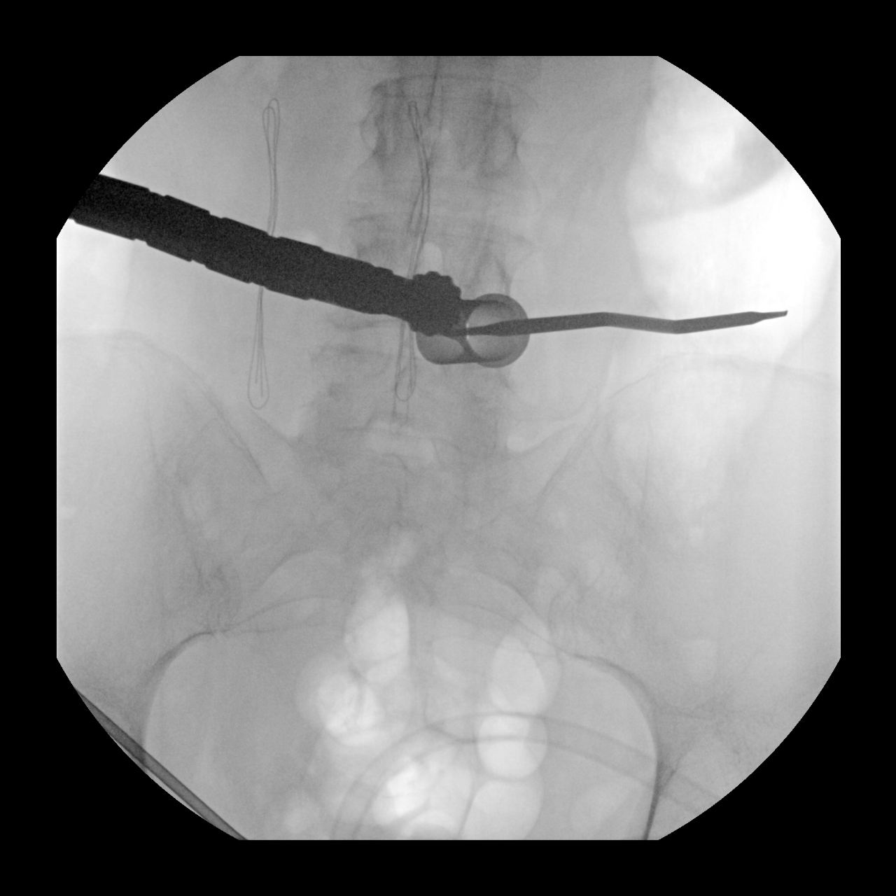

[4 of 4 positions shown; findings below may reference images not displayed]

FINDINGS: Intraoperative fluoroscopic images of the lumbar spine demonstrate
marking instruments overlying the dorsal aspect of L4-L5.
IMPRESSION: Intraoperative fluoroscopic images of the lumbar spine demonstrate
marking instruments overlying the dorsal aspect of L4-L5.

## 2022-12-12 ENCOUNTER — Other Ambulatory Visit: Payer: Self-pay | Admitting: Internal Medicine

## 2022-12-12 DIAGNOSIS — Z1231 Encounter for screening mammogram for malignant neoplasm of breast: Secondary | ICD-10-CM

## 2023-01-26 ENCOUNTER — Ambulatory Visit
Admission: RE | Admit: 2023-01-26 | Discharge: 2023-01-26 | Disposition: A | Discharge status: Home / Self Care | Payer: BC Managed Care – PPO | Source: Ambulatory Visit | Attending: Internal Medicine | Admitting: Internal Medicine

## 2023-01-26 DIAGNOSIS — Z1231 Encounter for screening mammogram for malignant neoplasm of breast: Secondary | ICD-10-CM | POA: Diagnosis present

## 2023-06-19 ENCOUNTER — Ambulatory Visit
Admission: EM | Admit: 2023-06-19 | Discharge: 2023-06-19 | Disposition: A | Discharge status: Home / Self Care | Payer: BC Managed Care – PPO | Attending: Emergency Medicine | Admitting: Emergency Medicine

## 2023-06-19 ENCOUNTER — Ambulatory Visit: Payer: BC Managed Care – PPO

## 2023-06-19 DIAGNOSIS — S6991XA Unspecified injury of right wrist, hand and finger(s), initial encounter: Secondary | ICD-10-CM | POA: Diagnosis not present

## 2023-06-19 DIAGNOSIS — S6990XA Unspecified injury of unspecified wrist, hand and finger(s), initial encounter: Secondary | ICD-10-CM | POA: Diagnosis not present

## 2023-06-19 DIAGNOSIS — Z203 Contact with and (suspected) exposure to rabies: Secondary | ICD-10-CM | POA: Diagnosis not present

## 2023-06-19 DIAGNOSIS — W540XXA Bitten by dog, initial encounter: Secondary | ICD-10-CM | POA: Diagnosis not present

## 2023-06-19 MED ORDER — MUPIROCIN 2 % EX OINT: 1.0000 | TOPICAL_OINTMENT | Freq: Two times a day (BID) | CUTANEOUS | 0 refills | Status: AC

## 2023-06-19 MED ORDER — TRIPLE ANTIBIOTIC 3.5-400-5000 EX OINT: 1.0000 | TOPICAL_OINTMENT | Freq: Once | CUTANEOUS | Status: DC

## 2023-06-19 MED ORDER — TETANUS-DIPHTH-ACELL PERTUSSIS 5-2.5-18.5 LF-MCG/0.5 IM SUSY
0.5000 mL | PREFILLED_SYRINGE | Freq: Once | INTRAMUSCULAR | Status: AC
  Administered 2023-06-19: 0.5 mL via INTRAMUSCULAR

## 2023-06-19 MED ORDER — DOXYCYCLINE HYCLATE 100 MG PO CAPS: 100.0000 mg | ORAL_CAPSULE | Freq: Two times a day (BID) | ORAL | 0 refills | Status: AC

## 2023-06-19 MED ORDER — CLINDAMYCIN HCL 150 MG PO CAPS: 300.0000 mg | ORAL_CAPSULE | Freq: Three times a day (TID) | ORAL | 0 refills | Status: AC

## 2023-06-19 MED ORDER — IBUPROFEN 600 MG PO TABS
600.0000 mg | ORAL_TABLET | Freq: Once | ORAL | Status: AC
  Administered 2023-06-19: 600 mg via ORAL

## 2023-06-19 NOTE — ED Provider Notes (Signed)
[facility]    CSN: [id number] Arrival date & time: 06/19/23  1526      History   Chief Complaint Chief Complaint  Patient presents with  . Animal Bite    RT middle finger    HPI Julie Pace is a 62 y.o. female.   61 year old female, Julie Pace, presents to urgent care for evaluation of right middle finger dog bite.  Patient states she was at her aunts visiting, went to give aunt kiss and dog bit her finger and would not turn loose, patient states she had to hit dog in the nose to get it to release.  Dog shots are up-to-date, patient unsure of her tetanus status ,will update in office.  Patient is left-hand dominant.  Bleeding controlled at this time with dressing  The history is provided by the patient. No language interpreter was used.  Animal Bite   Past Medical History:  Diagnosis Date  . Anxiety   . Carpal tunnel syndrome   . Complication of anesthesia   . Depression   . Family history of adverse reaction to anesthesia    PT STATES SHE THINKS HER MOM HAD REACTION TO ANESTHESIA BUT IS UNSURE WHAT REACTION WAS  . GERD (gastroesophageal reflux disease)   . History of methicillin resistant staphylococcus aureus (MRSA)    YEARS AGO  . Hypothyroidism   . PONV (postoperative nausea and vomiting)   . Seizures (HCC)    LAST SEIZURE AGE 1  . Thyroid disease     Patient Active Problem List   Diagnosis Date Noted  . Dog bite 06/19/2023  . Finger injury, initial encounter 06/19/2023  . DDD (degenerative disc disease), lumbar 06/17/2019  . Dizziness of unknown cause 06/06/2019  . Acquired hypothyroidism 09/18/2014  . Anxiety 09/18/2014  . Menopausal symptoms 09/18/2014    Past Surgical History:  Procedure Laterality Date  . COLONOSCOPY WITH PROPOFOL N/A 04/19/2021   Procedure: COLONOSCOPY WITH PROPOFOL;  Surgeon: Wyline Mood, MD;  Location: [facility] ENDOSCOPY;  Service: Gastroenterology;  Laterality: N/A;  . EXCISION MASS UPPER EXTREMETIES  Right 04/02/2018   Procedure: EXCISION MASS RIGHT SHOULDER;  Surgeon: Sung Amabile, DO;  Location: [facility] ORS;  Service: General;  Laterality: Right;  . LASIK Bilateral 2008  . LUMBAR LAMINECTOMY/DECOMPRESSION MICRODISCECTOMY N/A 09/30/2021   Procedure: L4-5 POSTERIOR SPINAL DECOMPRESSION;  Surgeon: Venetia Night, MD;  Location: [facility] ORS;  Service: Neurosurgery;  Laterality: N/A;  . TUBAL LIGATION  1990    OB History   No obstetric history on file.      Home Medications    Prior to Admission medications   Medication Sig Start Date End Date Taking? Authorizing Provider  clindamycin (CLEOCIN) 150 MG capsule Take 2 capsules (300 mg total) by mouth 3 (three) times daily for 7 days. 06/19/23 06/26/23 Yes Namita Yearwood, Para March, NP  doxycycline (VIBRAMYCIN) 100 MG capsule Take 1 capsule (100 mg total) by mouth 2 (two) times daily for 7 days. 06/19/23 06/26/23 Yes Yanisa Goodgame, Para March, NP  mupirocin ointment (BACTROBAN) 2 % Apply 1 Application topically 2 (two) times daily for 7 days. Right middle finger 06/19/23 06/26/23 Yes Quintrell Baze, Para March, NP  ascorbic acid (VITAMIN C) 250 MG CHEW Chew 250 mg by mouth daily.    [provider]  baclofen (LIORESAL) 10 MG tablet Take 1 tablet (10 mg total) by mouth 3 (three) times daily. Patient not taking: Reported on 01/27/2022 08/12/21   Becky Augusta, NP  BEE POLLEN PO Take 2 capsules by mouth daily.  Patient not taking: Reported on 01/27/2022    [provider]  celecoxib (CELEBREX) 100 MG capsule Take 1 capsule (100 mg total) by mouth 2 (two) times daily. Patient not taking: Reported on 01/27/2022 09/30/21   Susanne Borders, PA  Cholecalciferol (D3-1000) 25 MCG (1000 UT) capsule Take 1,000 Units by mouth daily.    [provider]  Cranberry 500 MG CAPS Take 500 mg by mouth daily.    [provider]  famotidine (PEPCID) 20 MG tablet Take 20 mg by mouth as needed.    [provider]  levothyroxine (SYNTHROID,  LEVOTHROID) 175 MCG tablet Take 175 mcg by mouth daily before breakfast. 01/15/18   [provider]  loratadine (CLARITIN) 10 MG tablet Take 10 mg by mouth daily as needed for allergies.    [provider]  METAMUCIL FIBER PO Take 1 Dose by mouth daily as needed (constipation).    [provider]  Multiple Vitamin (MULTIVITAMIN) capsule Take 1 capsule by mouth daily.    [provider]  Omega-3 Fatty Acids (FISH OIL PO) Take 1 capsule by mouth daily.    [provider]  OVER THE COUNTER MEDICATION Take 1 capsule by mouth daily. Alpilean otc supplement Patient not taking: Reported on 01/27/2022    [provider]  OVER THE COUNTER MEDICATION Take 2 tablets by mouth daily. vital roots otc supplement Patient not taking: Reported on 01/27/2022    [provider]  senna (SENOKOT) 8.6 MG TABS tablet Take 1 tablet (8.6 mg total) by mouth daily as needed for mild constipation. Patient not taking: Reported on 01/27/2022 09/30/21   Susanne Borders, PA  triamcinolone (NASACORT) 55 MCG/ACT AERO nasal inhaler Place 2 sprays into the nose daily as needed (allergies).    [provider]    Family History Family History  Problem Relation Age of Onset  . Hypertension Mother   . Diverticulitis Mother   . Stroke Father   . Diabetes Maternal Aunt   . Crohn's disease Paternal Aunt   . Cirrhosis Other   . Lupus Other     Social History Social History   Tobacco Use  . Smoking status: Former    Current packs/day: 0.00    Average packs/day: 0.5 packs/day for 10.0 years (5.0 ttl pk-yrs)    Types: Cigarettes    Start date: 09/24/2011    Quit date: 09/23/2021    Years since quitting: 1.7  . Smokeless tobacco: Never  Vaping Use  . Vaping status: Never Used  Substance Use Topics  . Alcohol use: Yes    Comment: BEER OCC  . Drug use: No     Allergies   Codeine, Penicillins, Phenobarbital, and Vicodin  [hydrocodone-acetaminophen]   Review of Systems Review of Systems  Musculoskeletal:  Positive for arthralgias.  Skin:  Positive for wound.  All other systems reviewed and are negative.    Physical Exam Triage Vital Signs ED Triage Vitals [06/19/23 1549]  Encounter Vitals Group     BP      Systolic BP Percentile      Diastolic BP Percentile      Pulse      Resp      Temp      Temp src      SpO2      Weight      Height      Head Circumference      Peak Flow      Pain Score 5  Pain Loc      Pain Education      Exclude from Growth Chart    No data found.  Updated Vital Signs BP (!) 150/82 (BP Location: Left Arm)   Pulse 70   Temp 98.4 F (36.9 C) (Oral)   SpO2 99%   Visual Acuity Right Eye Distance:   Left Eye Distance:   Bilateral Distance:    Right Eye Near:   Left Eye Near:    Bilateral Near:     Physical Exam Vitals and nursing note reviewed.  Constitutional:      General: She is not in acute distress.    Appearance: She is well-developed and well-groomed.  HENT:     Head: Normocephalic and atraumatic.  Eyes:     Conjunctiva/sclera: Conjunctivae normal.  Cardiovascular:     Rate and Rhythm: Normal rate and regular rhythm.     Pulses: Normal pulses.          Radial pulses are 2+ on the right side.     Heart sounds: No murmur heard. Pulmonary:     Effort: Pulmonary effort is normal. No respiratory distress.     Breath sounds: Normal breath sounds.  Abdominal:     Palpations: Abdomen is soft.     Tenderness: There is no abdominal tenderness.  Musculoskeletal:        General: No swelling.     Cervical back: Neck supple.  Skin:    General: Skin is warm and dry.     Capillary Refill: Capillary refill takes less than 2 seconds.     Findings: Signs of injury and wound present.     Comments: Right middle finger with puncture wound noted to distal aspect of finger, no nail involvement.  Neurological:     General: No focal deficit present.      Mental Status: She is alert and oriented to person, place, and time.     GCS: GCS eye subscore is 4. GCS verbal subscore is 5. GCS motor subscore is 6.     Cranial Nerves: No cranial nerve deficit.     Sensory: No sensory deficit.  Psychiatric:        Attention and Perception: Attention normal.        Mood and Affect: Mood normal.        Speech: Speech normal.        Behavior: Behavior is cooperative.      UC Treatments / Results  Labs (all labs ordered are listed, but only abnormal results are displayed) Labs Reviewed - No data to display  EKG   Radiology DG Finger Middle Right Result Date: 06/19/2023 CLINICAL DATA:  Dog bite to the right middle finger. EXAM: RIGHT MIDDLE FINGER 2+V COMPARISON:  None Available. FINDINGS: There is no evidence of fracture or dislocation. Degenerative change of the distal interphalangeal joint with joint space narrowing and spurring. Mild soft tissue edema. No radiopaque foreign body. IMPRESSION: Mild soft tissue edema. No fracture or radiopaque foreign body. Electronically Signed   By: Narda Rutherford M.D.   On: 06/19/2023 17:48    Procedures Procedures (including critical care time)  Medications Ordered in UC Medications  neomycin-bacitracin-polymyxin [id number] OINT 1 Application (has no administration in time range)  Tdap (BOOSTRIX) injection 0.5 mL (0.5 mLs Intramuscular Given 06/19/23 1607)  ibuprofen (ADVIL) tablet 600 mg (600 mg Oral Given 06/19/23 1655)    Initial Impression / Assessment and Plan / UC Course  I have reviewed the triage vital signs and  the nursing notes.  Pertinent labs & imaging results that were available during my care of the patient were reviewed by me and considered in my medical decision making (see chart for details).  Clinical Course as of 06/19/23 2122  Tue Jun 19, 2023  1645 Right middle finger wet read by this provider,negative. Wound soaked and cleaned, pat dry,abx ointment nonstick dressing applied by  staff. Tetanus updated today, follow up with Ortho in 3 days for wound recheck,sooner if worse.  Animal control paperwork completed by staff. [JD]    Clinical Course User Index [JD] Pal Shell, Para March, NP   X-ray of right middle finger is negative for fracture, wound cleaned well, dressing applied, will treat with clindamycin and doxycycline as patient is allergic to penicillin.  Tetanus updated in office today.  Strict go to ER precautions given, patient verbalized understanding to this provider.   Ddx: Dog bite, right middle finger injury Final Clinical Impressions(s) / UC Diagnoses   Final diagnoses:  Dog bite, initial encounter  Finger injury, initial encounter     Discharge Instructions      Rest, ice, elevate, daily dressing changes.  Your x-ray does not show any fracture or dislocation at this time.  Take antibiotics as directed, follow-up with PCP orthopedics for wound recheck in 3 days/sooner if worse-call for appointment.     ED Prescriptions     Medication Sig Dispense Auth. Provider   clindamycin (CLEOCIN) 150 MG capsule Take 2 capsules (300 mg total) by mouth 3 (three) times daily for 7 days. 42 capsule Jessiah Wojnar, NP   doxycycline (VIBRAMYCIN) 100 MG capsule Take 1 capsule (100 mg total) by mouth 2 (two) times daily for 7 days. 14 capsule Chevon Laufer, NP   mupirocin ointment (BACTROBAN) 2 % Apply 1 Application topically 2 (two) times daily for 7 days. Right middle finger 15 g Ramello Cordial, NP      PDMP not reviewed this encounter.   Clancy Gourd, NP 06/19/23 2121

## 2023-06-19 NOTE — ED Triage Notes (Addendum)
Pt presents to UC for dog bite to RT middle finger, pt states she went up to kiss her aunts dog and her dog bit her. Pt states she believes dog did have rabies updated about 9 months ago. Pt states she is unsure when her last tetanus was.   Last tdap per chart: 09/10/2017

## 2023-06-19 NOTE — Discharge Instructions (Signed)
Rest, ice, elevate, daily dressing changes.  Your x-ray does not show any fracture or dislocation at this time.  Take antibiotics as directed, follow-up with PCP orthopedics for wound recheck in 3 days/sooner if worse-call for appointment.

## 2023-06-26 ENCOUNTER — Ambulatory Visit
Admission: EM | Admit: 2023-06-26 | Discharge: 2023-06-26 | Disposition: A | Discharge status: Home / Self Care | Payer: BC Managed Care – PPO

## 2023-06-26 DIAGNOSIS — Z5189 Encounter for other specified aftercare: Secondary | ICD-10-CM

## 2023-06-26 NOTE — ED Triage Notes (Signed)
Patient states that she's here for a follow up from dog bite on her right middle finger. She states that the area gets red and inflamed. Cleaning the area 2 times a day. Patient states that she feels nerve shocks go up her arm.

## 2023-06-26 NOTE — Discharge Instructions (Addendum)
Continue your antibiotics as previously prescribed.  For the nerve pain I recommend that you take an over-the-counter B multivitamin along with alpha lipoic acid.  Both of these will help improve nerve regeneration.  Your mobility will gradually return the more you move and utilize your right hand and right middle finger.  I have given you some hand exercises that she could perform at home to help maximize your return of mobility.  If your mobility does not return or the nerve pain does not improve I recommend that you follow-up with a hand specialist such as Dr. Mathis Bud at [facility] in [place] or [place].

## 2023-06-26 NOTE — ED Provider Notes (Signed)
[facility]    CSN: [id number] Arrival date & time: 06/26/23  1318      History   Chief Complaint Chief Complaint  Patient presents with   Wound Check    HPI Julie Pace is a 62 y.o. female.   HPI  62 year old female with past medical history significant for hypothyroidism, anxiety, carpal tunnel syndrome, seizures, GERD, depression presents for reevaluation of a dog bite she sustained to her right middle finger 7 days ago.  She was discharged home on clindamycin, doxycycline, and mupirocin and she is continuing to take all of her antibiotics as prescribed.  She reports that she has been experiencing intermittent electric shocks traveling up her finger into her forearm as far as her elbow at various times.  She is concerned she might have a nerve injury.  She denies any fever or pus drainage from her wounds.  Past Medical History:  Diagnosis Date   Anxiety    Carpal tunnel syndrome    Complication of anesthesia    Depression    Family history of adverse reaction to anesthesia    PT STATES SHE THINKS HER MOM HAD REACTION TO ANESTHESIA BUT IS UNSURE WHAT REACTION WAS   GERD (gastroesophageal reflux disease)    History of methicillin resistant staphylococcus aureus (MRSA)    YEARS AGO   Hypothyroidism    PONV (postoperative nausea and vomiting)    Seizures (HCC)    LAST SEIZURE AGE 49   Thyroid disease     Patient Active Problem List   Diagnosis Date Noted   Dog bite 06/19/2023   Finger injury, initial encounter 06/19/2023   DDD (degenerative disc disease), lumbar 06/17/2019   Dizziness of unknown cause 06/06/2019   Acquired hypothyroidism 09/18/2014   Anxiety 09/18/2014   Menopausal symptoms 09/18/2014    Past Surgical History:  Procedure Laterality Date   COLONOSCOPY WITH PROPOFOL N/A 04/19/2021   Procedure: COLONOSCOPY WITH PROPOFOL;  Surgeon: Wyline Mood, MD;  Location: [facility] ENDOSCOPY;  Service: Gastroenterology;  Laterality: N/A;    EXCISION MASS UPPER EXTREMETIES Right 04/02/2018   Procedure: EXCISION MASS RIGHT SHOULDER;  Surgeon: Sung Amabile, DO;  Location: [facility] ORS;  Service: General;  Laterality: Right;   LASIK Bilateral 2008   LUMBAR LAMINECTOMY/DECOMPRESSION MICRODISCECTOMY N/A 09/30/2021   Procedure: L4-5 POSTERIOR SPINAL DECOMPRESSION;  Surgeon: Venetia Night, MD;  Location: [facility] ORS;  Service: Neurosurgery;  Laterality: N/A;   TUBAL LIGATION  1990    OB History   No obstetric history on file.      Home Medications    Prior to Admission medications   Medication Sig Start Date End Date Taking? Authorizing Provider  Cholecalciferol (D3-1000) 25 MCG (1000 UT) capsule Take 1,000 Units by mouth daily.   Yes [provider]  clindamycin (CLEOCIN) 150 MG capsule Take 2 capsules (300 mg total) by mouth 3 (three) times daily for 7 days. 06/19/23 06/26/23 Yes Defelice, Para March, NP  Cranberry 500 MG CAPS Take 500 mg by mouth daily.   Yes [provider]  doxycycline (VIBRAMYCIN) 100 MG capsule Take 1 capsule (100 mg total) by mouth 2 (two) times daily for 7 days. 06/19/23 06/26/23 Yes Defelice, Para March, NP  levothyroxine (SYNTHROID, LEVOTHROID) 175 MCG tablet Take 175 mcg by mouth daily before breakfast. 01/15/18  Yes [provider]  loratadine (CLARITIN) 10 MG tablet Take 10 mg by mouth daily as needed for allergies.   Yes [provider]  Multiple Vitamin (MULTIVITAMIN) capsule Take 1 capsule by mouth  daily.   Yes [provider]  mupirocin ointment (BACTROBAN) 2 % Apply 1 Application topically 2 (two) times daily for 7 days. Right middle finger 06/19/23 06/26/23 Yes Defelice, Para March, NP  Omega-3 Fatty Acids (FISH OIL PO) Take 1 capsule by mouth daily.   Yes [provider]  ascorbic acid (VITAMIN C) 250 MG CHEW Chew 250 mg by mouth daily.    [provider]  baclofen (LIORESAL) 10 MG tablet Take 1 tablet (10 mg total) by mouth 3 (three) times  daily. Patient not taking: Reported on 01/27/2022 08/12/21   Becky Augusta, NP  BEE POLLEN PO Take 2 capsules by mouth daily. Patient not taking: Reported on 01/27/2022    [provider]  celecoxib (CELEBREX) 100 MG capsule Take 1 capsule (100 mg total) by mouth 2 (two) times daily. Patient not taking: Reported on 01/27/2022 09/30/21   Susanne Borders, PA  famotidine (PEPCID) 20 MG tablet Take 20 mg by mouth as needed.    [provider]  METAMUCIL FIBER PO Take 1 Dose by mouth daily as needed (constipation).    [provider]  OVER THE COUNTER MEDICATION Take 1 capsule by mouth daily. Alpilean otc supplement Patient not taking: Reported on 01/27/2022    [provider]  OVER THE COUNTER MEDICATION Take 2 tablets by mouth daily. vital roots otc supplement Patient not taking: Reported on 01/27/2022    [provider]  senna (SENOKOT) 8.6 MG TABS tablet Take 1 tablet (8.6 mg total) by mouth daily as needed for mild constipation. Patient not taking: Reported on 01/27/2022 09/30/21   Susanne Borders, PA  triamcinolone (NASACORT) 55 MCG/ACT AERO nasal inhaler Place 2 sprays into the nose daily as needed (allergies).    [provider]    Family History Family History  Problem Relation Age of Onset   Hypertension Mother    Diverticulitis Mother    Stroke Father    Diabetes Maternal Aunt    Crohn's disease Paternal Aunt    Cirrhosis Other    Lupus Other     Social History Social History   Tobacco Use   Smoking status: Former    Current packs/day: 0.00    Average packs/day: 0.5 packs/day for 10.0 years (5.0 ttl pk-yrs)    Types: Cigarettes    Start date: 09/24/2011    Quit date: 09/23/2021    Years since quitting: 1.7   Smokeless tobacco: Never  Vaping Use   Vaping status: Never Used  Substance Use Topics   Alcohol use: Yes    Comment: BEER OCC   Drug use: No     Allergies   Codeine, Penicillins, Phenobarbital, and Vicodin  [hydrocodone-acetaminophen]   Review of Systems Review of Systems  Constitutional:  Negative for fever.  Skin:  Positive for wound. Negative for color change.  Neurological:        Electric shocks up finger, hand, and forearm.     Physical Exam Triage Vital Signs ED Triage Vitals  Encounter Vitals Group     BP      Systolic BP Percentile      Diastolic BP Percentile      Pulse      Resp      Temp      Temp src      SpO2      Weight      Height      Head Circumference      Peak Flow  Pain Score      Pain Loc      Pain Education      Exclude from Growth Chart    No data found.  Updated Vital Signs BP (!) 146/79 (BP Location: Right Arm)   Pulse 74   Temp (!) 97.3 F (36.3 C) (Oral)   Resp 19   SpO2 97%   Visual Acuity Right Eye Distance:   Left Eye Distance:   Bilateral Distance:    Right Eye Near:   Left Eye Near:    Bilateral Near:     Physical Exam Vitals and nursing note reviewed.  Constitutional:      Appearance: Normal appearance. She is not ill-appearing.  Musculoskeletal:        General: Tenderness and signs of injury present. No swelling or deformity. Normal range of motion.  Skin:    General: Skin is warm and dry.     Capillary Refill: Capillary refill takes less than 2 seconds.     Findings: No bruising or erythema.  Neurological:     General: No focal deficit present.     Mental Status: She is alert and oriented to person, place, and time.      UC Treatments / Results  Labs (all labs ordered are listed, but only abnormal results are displayed) Labs Reviewed - No data to display  EKG   Radiology No results found.  Procedures Procedures (including critical care time)  Medications Ordered in UC Medications - No data to display  Initial Impression / Assessment and Plan / UC Course  I have reviewed the triage vital signs and the nursing notes.  Pertinent labs & imaging results that were available during my care of the  patient were reviewed by me and considered in my medical decision making (see chart for details).   Patient is a nontoxic-appearing 62 year old female presenting with request for wound check and to discuss electric shock sensations that she has been experiencing in her middle finger that travel up her forearm at different times and to different levels.  Some have gone as far as to her elbow.    As you can see in images above, the wounds are well-healed and they are free of erythema, edema, or pus drainage.  There is no edema to the DIP joint or distal phalanx where the bite was sustained.  Patient has mostly complete range of motion with closing of her fist though it is mildly decreased to her right middle finger.  I have advised her that she should continue using her hand and performing range of motion exercises to improve her mobility and that will improve over time.  With regards to the electric shock sensations that may very well be related to nerve regeneration due to nerve injury from the dog bite.  I advised her to take B complex multivitamin as well as alpha lipoic acid to help improve her nerve regeneration.   Final Clinical Impressions(s) / UC Diagnoses   Final diagnoses:  Visit for wound check     Discharge Instructions      Continue your antibiotics as previously prescribed.  For the nerve pain I recommend that you take an over-the-counter B multivitamin along with alpha lipoic acid.  Both of these will help improve nerve regeneration.  Your mobility will gradually return the more you move and utilize your right hand and right middle finger.  I have given you some hand exercises that she could perform at home to help maximize your  return of mobility.  If your mobility does not return or the nerve pain does not improve I recommend that you follow-up with a hand specialist such as Dr. Mathis Bud at [facility] in [place] or [place].      ED Prescriptions   None     PDMP not reviewed this encounter.   Becky Augusta, NP 06/26/23 1441

## 2023-08-14 NOTE — Progress Notes (Unsigned)
Referring Physician:  Enid Baas, MD [address] [place],  [place] [address]  Primary Physician:  Enid Baas, MD  History of Present Illness: 08/15/2023 Ms. Julie Pace has a history of epilepsy as a child, hypothyroidism, OSA  She is s/p L4-L5 decompression by Dr. Myer Haff on 09/30/21. She did well postop with improvement in her pain.   In January, she had increased right groin pain tripping when going down an incline and landing hard on her right leg. Some radiation of pain to her lateral right hip. Pain is intermittent. She has pain when she gets in/out of a car. No numbness, tingling, or weakness. No current LBP. No current left leg pain. Pain is worse with stepping to the side. Pain has been better over the last 2 weeks.   She takes prn motrin.   She quit smoking on 09/23/21, but restarted. She is now smoking 1/2 to 3/4 PPD.   Bowel/Bladder Dysfunction: one episode of urinary urgency at night a few days ago. No urinary/bowel incontinence. No perineal numbness.   Conservative measures:  Physical therapy:  has not participated for this episode Multimodal medical therapy including regular antiinflammatories:  prednisone, advil, gabapentin, baclofen Injections:  08/04/2021: Bilateral S1 transforaminal ESI (good relief) 04/09/2020: Bilateral S1 transforaminal ESI (good relief for over a year) 11/06/2019: Right S1 transforaminal ESI (good relief)   Past Surgery:  09/30/21: L4-5 Posterior Spinal Decompression 04/2018 excision of lipoma from back  The symptoms are causing a significant impact on the patient's life.   Review of Systems:  A 10 point review of systems is negative, except for the pertinent positives and negatives detailed in the HPI.  Past Medical History: Past Medical History:  Diagnosis Date   Anxiety    Carpal tunnel syndrome    Complication of anesthesia    Depression    Family history of adverse reaction to anesthesia    PT  STATES SHE THINKS HER MOM HAD REACTION TO ANESTHESIA BUT IS UNSURE WHAT REACTION WAS   GERD (gastroesophageal reflux disease)    History of methicillin resistant staphylococcus aureus (MRSA)    YEARS AGO   Hypothyroidism    PONV (postoperative nausea and vomiting)    Seizures (HCC)    LAST SEIZURE AGE 73   Thyroid disease     Past Surgical History: Past Surgical History:  Procedure Laterality Date   COLONOSCOPY WITH PROPOFOL N/A 04/19/2021   Procedure: COLONOSCOPY WITH PROPOFOL;  Surgeon: Wyline Mood, MD;  Location: [facility] ENDOSCOPY;  Service: Gastroenterology;  Laterality: N/A;   EXCISION MASS UPPER EXTREMETIES Right 04/02/2018   Procedure: EXCISION MASS RIGHT SHOULDER;  Surgeon: Sung Amabile, DO;  Location: [facility] ORS;  Service: General;  Laterality: Right;   LASIK Bilateral 2008   LUMBAR LAMINECTOMY/DECOMPRESSION MICRODISCECTOMY N/A 09/30/2021   Procedure: L4-5 POSTERIOR SPINAL DECOMPRESSION;  Surgeon: Venetia Night, MD;  Location: [facility] ORS;  Service: Neurosurgery;  Laterality: N/A;   TUBAL LIGATION  1990    Allergies: Allergies as of 08/15/2023 - Review Complete 08/15/2023  Allergen Reaction Noted   Codeine Nausea Only and Other (See Comments) 02/05/2015   Penicillins Hives and Other (See Comments) 02/05/2015   Phenobarbital Other (See Comments) 03/21/2018   Vicodin [hydrocodone-acetaminophen]  03/26/2018    Medications: Outpatient Encounter Medications as of 08/15/2023  Medication Sig   Probiotic Product (PROBIOTIC DAILY PO) Take by mouth daily.   ascorbic acid (VITAMIN C) 250 MG CHEW Chew 250 mg by mouth daily.   Cholecalciferol (D3-1000) 25 MCG (1000 UT) capsule  Take 1,000 Units by mouth daily.   Cranberry 500 MG CAPS Take 500 mg by mouth daily.   famotidine (PEPCID) 20 MG tablet Take 20 mg by mouth as needed.   levothyroxine (SYNTHROID, LEVOTHROID) 175 MCG tablet Take 175 mcg by mouth daily before breakfast.   loratadine (CLARITIN) 10 MG tablet Take 10 mg by mouth  daily as needed for allergies.   Multiple Vitamin (MULTIVITAMIN) capsule Take 1 capsule by mouth daily.   Omega-3 Fatty Acids (FISH OIL PO) Take 1 capsule by mouth daily.   OVER THE COUNTER MEDICATION Take 1 capsule by mouth daily. Alpilean otc supplement (Patient not taking: Reported on 01/27/2022)   OVER THE COUNTER MEDICATION Take 2 tablets by mouth daily. vital roots otc supplement (Patient not taking: Reported on 01/27/2022)   triamcinolone (NASACORT) 55 MCG/ACT AERO nasal inhaler Place 2 sprays into the nose daily as needed (allergies).   [DISCONTINUED] baclofen (LIORESAL) 10 MG tablet Take 1 tablet (10 mg total) by mouth 3 (three) times daily. (Patient not taking: Reported on 01/27/2022)   [DISCONTINUED] BEE POLLEN PO Take 2 capsules by mouth daily. (Patient not taking: Reported on 01/27/2022)   [DISCONTINUED] celecoxib (CELEBREX) 100 MG capsule Take 1 capsule (100 mg total) by mouth 2 (two) times daily. (Patient not taking: Reported on 01/27/2022)   [DISCONTINUED] METAMUCIL FIBER PO Take 1 Dose by mouth daily as needed (constipation).   [DISCONTINUED] senna (SENOKOT) 8.6 MG TABS tablet Take 1 tablet (8.6 mg total) by mouth daily as needed for mild constipation. (Patient not taking: Reported on 01/27/2022)   No facility-administered encounter medications on file as of 08/15/2023.    Social History: Social History   Tobacco Use   Smoking status: Every Day    Current packs/day: 0.00    Average packs/day: 0.5 packs/day for 10.0 years (5.0 ttl pk-yrs)    Types: Cigarettes    Start date: 09/24/2011    Last attempt to quit: 09/23/2021    Years since quitting: 1.8   Smokeless tobacco: Never   Tobacco comments:    1/2-3/4 ppd  Vaping Use   Vaping status: Never Used  Substance Use Topics   Alcohol use: Yes    Comment: BEER OCC   Drug use: No    Family Medical History: Family History  Problem Relation Age of Onset   Hypertension Mother    Diverticulitis Mother    Stroke Father     Diabetes Maternal Aunt    Crohn's disease Paternal Aunt    Cirrhosis Other    Lupus Other     Physical Examination: Vitals:   08/15/23 1107  BP: 130/82    General: Patient is well developed, well nourished, calm, collected, and in no apparent distress. Attention to examination is appropriate.  Respiratory: Patient is breathing without any difficulty.   NEUROLOGICAL:     Awake, alert, oriented to person, place, and time.  Speech is clear and fluent. Fund of knowledge is appropriate.   Cranial Nerves: Pupils equal round and reactive to light.  Facial tone is symmetric.    Well healed lumbar incision. No lower lumbar tenderness.  No abnormal lesions on exposed skin.   Strength: Side Biceps Triceps Deltoid Interossei Grip Wrist Ext. Wrist Flex.  R 5 5 5 5 5 5 5   L 5 5 5 5 5 5 5    Side Iliopsoas Quads Hamstring PF DF EHL  R 5 5 5 5 5 5   L 5 5 5 5 5 5    Reflexes are  2+ and symmetric at the biceps, brachioradialis, patella and achilles.   Hoffman's is absent.  Clonus is not present.   Bilateral upper and lower extremity sensation is intact to light touch.     She has mild tenderness over right greater trochanteric bursa. No tenderness over left greater trochanteric bursa.   She has right groin pain with IR/ER of right hip. No pain with IR/ER of left hip.   Gait is normal.     Medical Decision Making  Imaging: No recent lumbar imaging.   Assessment and Plan: Ms. Enright is s/p L4-L5 decompression by Dr. Myer Haff on 09/30/21. She did well postop with improvement in her pain.   Now with 1 month history of intermittent right groin pain with some radiation to her lateral hip. She has pain when she gets in/out of a car. No numbness, tingling, or weakness. No current LBP. No current left leg pain.   Her current pain appears to be right hip mediated. She has groin pain with IR/ER of right hip. May have component of right greater trochanteric bursitis as well.    Treatment options discussed with patient and following plan made:   - Referral to ortho at [facility] for evaluation of right hip pain.  - Activity as tolerated. Avoid anything that makes her pain worse.  - She will follow up prn.  I spent a total of 20 minutes in face-to-face and non-face-to-face activities related to this patient's care today including review of outside records, review of imaging, review of symptoms, physical exam, discussion of differential diagnosis, discussion of treatment options, and documentation.    Drake Leach PA-C Dept. of Neurosurgery

## 2023-08-15 ENCOUNTER — Encounter: Payer: Self-pay | Admitting: Orthopedic Surgery

## 2023-08-15 ENCOUNTER — Ambulatory Visit: Payer: BC Managed Care – PPO | Admitting: Orthopedic Surgery

## 2023-08-15 VITALS — BP 130/82 | Ht 66.0 in | Wt 189.8 lb

## 2023-08-15 DIAGNOSIS — M25551 Pain in right hip: Secondary | ICD-10-CM

## 2023-08-15 NOTE — Patient Instructions (Signed)
It was so nice to see you today. Thank you so much for coming in.    I think your pain is coming from your right hip.   I want you to see ortho at the [facility] for evaluation of right hip pain. They should call you to schedule an appointment or you can call them at [phone].   We will call you in 6 weeks to check on your progress.   Please do not hesitate to call if you have any questions or concerns. You can also message me in MyChart.   Drake Leach PA-C [phone]     The physicians and staff at [facility] at [facility] are committed to providing excellent care. You may receive a survey asking for feedback about your experience at our office. We value you your feedback and appreciate you taking the time to to fill it out. The [facility] leadership team is also available to discuss your experience in person, feel free to contact us [phone].

## 2024-04-07 ENCOUNTER — Other Ambulatory Visit: Payer: Self-pay | Admitting: Internal Medicine

## 2024-04-07 DIAGNOSIS — Z1231 Encounter for screening mammogram for malignant neoplasm of breast: Secondary | ICD-10-CM

## 2024-05-14 ENCOUNTER — Ambulatory Visit
Admission: RE | Admit: 2024-05-14 | Discharge: 2024-05-14 | Disposition: A | Discharge status: Home / Self Care | Source: Ambulatory Visit | Attending: Internal Medicine | Admitting: Internal Medicine

## 2024-05-14 DIAGNOSIS — Z1231 Encounter for screening mammogram for malignant neoplasm of breast: Secondary | ICD-10-CM | POA: Insufficient documentation

## 2024-05-19 ENCOUNTER — Other Ambulatory Visit: Payer: Self-pay | Admitting: Internal Medicine

## 2024-05-19 DIAGNOSIS — R928 Other abnormal and inconclusive findings on diagnostic imaging of breast: Secondary | ICD-10-CM

## 2024-05-21 ENCOUNTER — Ambulatory Visit
Admission: RE | Admit: 2024-05-21 | Discharge: 2024-05-21 | Disposition: A | Discharge status: Home / Self Care | Source: Ambulatory Visit | Attending: Internal Medicine | Admitting: Internal Medicine

## 2024-05-21 DIAGNOSIS — R928 Other abnormal and inconclusive findings on diagnostic imaging of breast: Secondary | ICD-10-CM | POA: Insufficient documentation
